# Patient Record
Sex: Female | Born: 2000 | Hispanic: Yes | Marital: Single | Smoking: Never smoker
Health system: Southern US, Community
[De-identification: ages and names within clinical notes are randomized; demographics above are authoritative.]

## PROBLEM LIST (undated history)

## (undated) ENCOUNTER — Inpatient Hospital Stay (HOSPITAL_COMMUNITY): Payer: Self-pay

## (undated) DIAGNOSIS — D649 Anemia, unspecified: Secondary | ICD-10-CM

## (undated) DIAGNOSIS — R002 Palpitations: Secondary | ICD-10-CM

## (undated) DIAGNOSIS — L309 Dermatitis, unspecified: Secondary | ICD-10-CM

## (undated) DIAGNOSIS — F419 Anxiety disorder, unspecified: Secondary | ICD-10-CM

## (undated) DIAGNOSIS — R519 Headache, unspecified: Secondary | ICD-10-CM

## (undated) DIAGNOSIS — F32A Depression, unspecified: Secondary | ICD-10-CM

## (undated) DIAGNOSIS — F329 Major depressive disorder, single episode, unspecified: Secondary | ICD-10-CM

## (undated) HISTORY — DX: Major depressive disorder, single episode, unspecified: F32.9

## (undated) HISTORY — DX: Hypocalcemia: E83.51

## (undated) HISTORY — DX: Depression, unspecified: F32.A

## (undated) HISTORY — PX: NO PAST SURGERIES: SHX2092

---
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
## 2022-01-28 ENCOUNTER — Encounter (HOSPITAL_COMMUNITY): Payer: Self-pay

## 2022-01-28 ENCOUNTER — Emergency Department (HOSPITAL_COMMUNITY)
Admission: EM | Admit: 2022-01-28 | Discharge: 2022-01-28 | Discharge status: Against Medical Advice | Payer: Medicaid Other | Attending: Emergency Medicine | Admitting: Emergency Medicine

## 2022-01-28 ENCOUNTER — Other Ambulatory Visit: Payer: Self-pay

## 2022-01-28 DIAGNOSIS — M545 Low back pain, unspecified: Secondary | ICD-10-CM | POA: Insufficient documentation

## 2022-01-28 DIAGNOSIS — Z5321 Procedure and treatment not carried out due to patient leaving prior to being seen by health care provider: Secondary | ICD-10-CM | POA: Diagnosis not present

## 2022-01-28 DIAGNOSIS — N939 Abnormal uterine and vaginal bleeding, unspecified: Secondary | ICD-10-CM | POA: Insufficient documentation

## 2022-01-28 LAB — URINALYSIS, ROUTINE W REFLEX MICROSCOPIC
Bilirubin Urine: NEGATIVE
Glucose, UA: NEGATIVE mg/dL
Ketones, ur: NEGATIVE mg/dL
Leukocytes,Ua: NEGATIVE
Nitrite: NEGATIVE
Protein, ur: NEGATIVE mg/dL
Specific Gravity, Urine: 1.024 (ref 1.005–1.030)
pH: 6 (ref 5.0–8.0)

## 2022-01-28 LAB — CBC
HCT: 35.8 % — ABNORMAL LOW (ref 36.0–46.0)
Hemoglobin: 11.4 g/dL — ABNORMAL LOW (ref 12.0–15.0)
MCH: 26.9 pg (ref 26.0–34.0)
MCHC: 31.8 g/dL (ref 30.0–36.0)
MCV: 84.4 fL (ref 80.0–100.0)
Platelets: 343 10*3/uL (ref 150–400)
RBC: 4.24 MIL/uL (ref 3.87–5.11)
RDW: 13.1 % (ref 11.5–15.5)
WBC: 7 10*3/uL (ref 4.0–10.5)
nRBC: 0 % (ref 0.0–0.2)

## 2022-01-28 LAB — BASIC METABOLIC PANEL
Anion gap: 8 (ref 5–15)
BUN: 11 mg/dL (ref 6–20)
CO2: 24 mmol/L (ref 22–32)
Calcium: 9.2 mg/dL (ref 8.9–10.3)
Chloride: 107 mmol/L (ref 98–111)
Creatinine, Ser: 0.54 mg/dL (ref 0.44–1.00)
GFR, Estimated: >60 mL/min (ref >60)
Glucose, Bld: 99 mg/dL (ref 70–99)
Potassium: 4 mmol/L (ref 3.5–5.1)
Sodium: 139 mmol/L (ref 135–145)

## 2022-01-28 LAB — I-STAT BETA HCG BLOOD, ED (MC, WL, AP ONLY): I-stat hCG, quantitative: <5 m[IU]/mL (ref <5)

## 2022-01-28 NOTE — ED Provider Triage Note (Signed)
Emergency Medicine Provider Triage Evaluation Note  Catherine Lawrence , a 21 y.o. female  was evaluated in triage.  Pt complains of vaginal bleeding for the past couple weeks since she has been off of her last menstrual cycle.  States she has heavy menstrual cycles at baseline however never abnormal bleeding in between her cycles.  She also endorses low back pain.  She is sexually active with occasional unprotected sex.  Denies abdominal pain.   Review of Systems  Positive: As above Negative: As above  Physical Exam  BP 123/81   Pulse 88   Temp 98.1 F (36.7 C) (Oral)   Resp 16   SpO2 100%  Gen:   Awake, no distress   Resp:  Normal effort  MSK:   Moves extremities without difficulty  Other:    Medical Decision Making  Medically screening exam initiated at 10:31 AM.  Appropriate orders placed.  Caroly Huerta-Garcia was informed that the remainder of the evaluation will be completed by another provider, this initial triage assessment does not replace that evaluation, and the importance of remaining in the ED until their evaluation is complete.     Marita Kansas, PA-C 01/28/22 1032

## 2022-01-28 NOTE — ED Triage Notes (Signed)
Patient complains of back pain and abnormal vaginal bleeding for the past few days, reports only notes the blood when she wipes, reports LMP 1 week ago

## 2022-01-28 NOTE — ED Notes (Signed)
Called x 2 for room placement with no answers.

## 2022-02-13 ENCOUNTER — Other Ambulatory Visit: Payer: Self-pay | Admitting: Family

## 2022-02-13 DIAGNOSIS — N939 Abnormal uterine and vaginal bleeding, unspecified: Secondary | ICD-10-CM

## 2022-02-15 ENCOUNTER — Ambulatory Visit
Admission: RE | Admit: 2022-02-15 | Discharge: 2022-02-15 | Disposition: A | Discharge status: Home / Self Care | Payer: Medicaid Other | Source: Ambulatory Visit | Attending: Family | Admitting: Family

## 2022-02-15 DIAGNOSIS — N939 Abnormal uterine and vaginal bleeding, unspecified: Secondary | ICD-10-CM

## 2022-05-14 ENCOUNTER — Ambulatory Visit (HOSPITAL_COMMUNITY)
Admission: RE | Admit: 2022-05-14 | Discharge: 2022-05-14 | Disposition: A | Discharge status: Home / Self Care | Payer: Medicaid Other | Source: Ambulatory Visit | Attending: Family Medicine | Admitting: Family Medicine

## 2022-05-14 ENCOUNTER — Encounter (HOSPITAL_COMMUNITY): Payer: Self-pay

## 2022-05-14 VITALS — BP 117/71 | HR 63 | Temp 98.3°F | Resp 18

## 2022-05-14 DIAGNOSIS — K219 Gastro-esophageal reflux disease without esophagitis: Secondary | ICD-10-CM

## 2022-05-14 DIAGNOSIS — R6881 Early satiety: Secondary | ICD-10-CM

## 2022-05-14 DIAGNOSIS — R140 Abdominal distension (gaseous): Secondary | ICD-10-CM

## 2022-05-14 MED ORDER — OMEPRAZOLE 20 MG PO CPDR: 20.0000 mg | DELAYED_RELEASE_CAPSULE | Freq: Every day | ORAL | 0 refills | Status: DC

## 2022-05-14 NOTE — Discharge Instructions (Signed)
You were seen today for GI issues.  I have sent out medications to help with this.  If you have continued or worsening symptoms then please be seen locally, or follow up with your primary care provider for further care.

## 2022-05-14 NOTE — ED Provider Notes (Signed)
[facility]    CSN: [id number] Arrival date & time: 05/14/22  1150      History   Chief Complaint No chief complaint on file.   HPI Catherine Lawrence is a 21 y.o. female.   Patient is here for GI upset.  Ate [employer] Saturday morning, then started with chills, low grade fever, nausea and vomiting.  She has been vomiting since Saturday, but none today.  When she eats she feel it getting stuck in her chest.  She is unable to burp and that feels uncomfortable.  No belly pain today, but she was having pain earlier.  She has not eaten much of anything.  She has not had much of a BM, but did have diarrhea recently.  She did take advil, pepto, tea, carbonated drinks.       Past Medical History:  Diagnosis Date   Anemia    Depression    hx of depression    Low calcium levels     There are no problems to display for this patient.   History reviewed. No pertinent surgical history.  OB History   No obstetric history on file.      Home Medications    Prior to Admission medications   Medication Sig Start Date End Date Taking? Authorizing Provider  cetirizine (ZYRTEC) 10 MG tablet Take 1 tablet by mouth daily. 09/21/19  Yes [provider]  FLUoxetine (PROZAC) 20 MG capsule TAKE 1 CAPSULE BY MOUTH EVERY MORNING FOR DEPRESSION OR ANXIETY 01/23/22  Yes [provider]  FEROSUL 325 (65 Fe) MG tablet Take by mouth.    [provider]  hydrocortisone 2.5 % cream Apply topically 3 (three) times daily. 03/10/17   Lowanda Foster, NP  ibuprofen (ADVIL,MOTRIN) 400 MG tablet Take 1 tablet (400 mg total) by mouth every 6 (six) hours as needed for fever or mild pain. 08/28/15   Lowanda Foster, NP    Family History Family History  Problem Relation Age of Onset   Diabetes Mother    Hypertension Mother    Diabetes Father     Social History Social History   Tobacco Use   Smoking status: Never   Smokeless tobacco: Never  Vaping Use    Vaping Use: Never used  Substance Use Topics   Alcohol use: Yes    Comment: socially   Drug use: Never     Allergies   Patient has no known allergies.   Review of Systems Review of Systems  Constitutional: Negative.   HENT: Negative.    Respiratory: Negative.    Cardiovascular: Negative.   Gastrointestinal:  Positive for abdominal pain, constipation, diarrhea, nausea and vomiting.  Musculoskeletal: Negative.   Skin: Negative.   Psychiatric/Behavioral: Negative.       Physical Exam Triage Vital Signs ED Triage Vitals  Enc Vitals Group     BP 05/14/22 1216 117/71     Pulse Rate 05/14/22 1216 63     Resp 05/14/22 1216 18     Temp 05/14/22 1216 98.3 F (36.8 C)     Temp Source 05/14/22 1216 Oral     SpO2 05/14/22 1216 95 %     Weight --      Height --      Head Circumference --      Peak Flow --      Pain Score 05/14/22 1213 0     Pain Loc --      Pain Edu? --      Excl.  in [place]? --    No data found.  Updated Vital Signs BP 117/71 (BP Location: Right Arm)   Pulse 63   Temp 98.3 F (36.8 C) (Oral)   Resp 18   LMP 05/07/2022 (Exact Date)   SpO2 95%   Visual Acuity Right Eye Distance:   Left Eye Distance:   Bilateral Distance:    Right Eye Near:   Left Eye Near:    Bilateral Near:     Physical Exam Constitutional:      General: She is not in acute distress.    Appearance: Normal appearance. She is not ill-appearing.  Cardiovascular:     Rate and Rhythm: Normal rate and regular rhythm.  Pulmonary:     Effort: Pulmonary effort is normal.  Abdominal:     General: Abdomen is flat. Bowel sounds are normal.     Palpations: Abdomen is soft.     Tenderness: There is no guarding or rebound.     Comments: Slight TTP to the lower abdomen;  no epigastric tenderness  Neurological:     General: No focal deficit present.     Mental Status: She is alert.  Psychiatric:        Mood and Affect: Mood normal.      UC Treatments / Results  Labs (all labs  ordered are listed, but only abnormal results are displayed) Labs Reviewed - No data to display  EKG   Radiology No results found.  Procedures Procedures (including critical care time)  Medications Ordered in UC Medications - No data to display  Initial Impression / Assessment and Plan / UC Course  I have reviewed the triage vital signs and the nursing notes.  Pertinent labs & imaging results that were available during my care of the patient were reviewed by me and considered in my medical decision making (see chart for details).   Final Clinical Impressions(s) / UC Diagnoses   Final diagnoses:  Gastroesophageal reflux disease without esophagitis  Bloating  Early satiety     Discharge Instructions      You were seen today for GI issues.  I have sent out medications to help with this.  If you have continued or worsening symptoms then please be seen locally, or follow up with your primary care provider for further care.     ED Prescriptions     Medication Sig Dispense Auth. Provider   omeprazole (PRILOSEC) 20 MG capsule Take 1 capsule (20 mg total) by mouth daily. 30 capsule Jannifer Franklin, MD      PDMP not reviewed this encounter.   Jannifer Franklin, MD 05/14/22 1245

## 2022-05-14 NOTE — ED Triage Notes (Signed)
Pt states she ate [employer] in the morning and started vomiting this afternoon. She hasn't vomited since yesterday but feels like when she burps she taste acid and can't burp without feeling like she is going to vomit. She did take one meds yesterday but it was "[place] medicine". She has been trying to drink a lot of soda so she can burp.   She states when she has a BM it is only a small amount she can't have a normal BM that is also since she ate at [employer] on Saturday morning.

## 2022-06-25 ENCOUNTER — Encounter (HOSPITAL_COMMUNITY): Payer: Self-pay

## 2022-06-25 ENCOUNTER — Ambulatory Visit (HOSPITAL_COMMUNITY)
Admission: EM | Admit: 2022-06-25 | Discharge: 2022-06-25 | Disposition: A | Discharge status: Home / Self Care | Payer: Medicaid Other | Attending: Family Medicine | Admitting: Family Medicine

## 2022-06-25 DIAGNOSIS — K219 Gastro-esophageal reflux disease without esophagitis: Secondary | ICD-10-CM

## 2022-06-25 DIAGNOSIS — M549 Dorsalgia, unspecified: Secondary | ICD-10-CM

## 2022-06-25 MED ORDER — PANTOPRAZOLE SODIUM 40 MG PO TBEC: 40.0000 mg | DELAYED_RELEASE_TABLET | Freq: Every day | ORAL | 0 refills | Status: DC

## 2022-06-25 MED ORDER — TIZANIDINE HCL 4 MG PO TABS: 4.0000 mg | ORAL_TABLET | Freq: Three times a day (TID) | ORAL | 0 refills | Status: DC | PRN

## 2022-06-25 NOTE — ED Triage Notes (Signed)
Pt is here for back pain on and off x few wks the patient also here for acid reflux flare2-3 months

## 2022-06-25 NOTE — Discharge Instructions (Signed)
Pantoprazole 40 mg--1 daily.  This is for gastritis or acid reflux.  It is meant to reduce the amount of acid your stomach makes   Take tizanidine 4 mg--1 every 8 hours as needed for muscle spasms; this medication can cause dizziness and sleepiness  Continue the Tylenol as needed

## 2022-06-25 NOTE — ED Provider Notes (Signed)
[place]    CSN: [id number] Arrival date & time: 06/25/22  0848      History   Chief Complaint Chief Complaint  Patient presents with   Back Pain    Acid reflux    HPI Catherine Lawrence is a 22 y.o. female.    Back Pain  Here for left upper back pain that began bothering her more this morning.  She notes that when she twists her torso or raises her arms overhead it causes pain in the left thoracic area.  No fever or cough.  She has had some back pain in this area off and on in the last 2 weeks.  No trauma or fall noted.  Also in the last 2 to 3 months she has been having burping.  In the middle of December she had a constellation of symptoms that at least initially sounded like a gastroenteritis, but then was left with a feeling of dysphagia and food hanging up in her upper throat, and then after she eats her stomach burns.  She mainly notes that may be heavier foods bother her more.  She does not eat much spicy food  She was prescribed omeprazole 20 mg in the middle of December; she does not feel that helped much.  She is taken some Tylenol for her upper back pain and is helped a little bit.  She notes an allergy to naproxen, consisting of a rash.  She does not remember ever reacting to the ibuprofen she has been prescribed in the past  Past Medical History:  Diagnosis Date   Anemia    Depression    hx of depression    Low calcium levels     There are no problems to display for this patient.   History reviewed. No pertinent surgical history.  OB History   No obstetric history on file.      Home Medications    Prior to Admission medications   Medication Sig Start Date End Date Taking? Authorizing Provider  cetirizine (ZYRTEC) 10 MG tablet Take 1 tablet by mouth daily. 09/21/19  Yes [provider]  FEROSUL 325 (65 Fe) MG tablet Take by mouth.   Yes [provider]  FLUoxetine (PROZAC) 20 MG capsule TAKE 1 CAPSULE BY MOUTH  EVERY MORNING FOR DEPRESSION OR ANXIETY 01/23/22  Yes [provider]  hydrocortisone 2.5 % cream Apply topically 3 (three) times daily. 03/10/17  Yes Kristen Cardinal, NP  omeprazole (PRILOSEC) 20 MG capsule Take 1 capsule (20 mg total) by mouth daily. 05/14/22  Yes Piontek, Junie Panning, MD    Family History Family History  Problem Relation Age of Onset   Diabetes Mother    Hypertension Mother    Diabetes Father     Social History Social History   Tobacco Use   Smoking status: Never   Smokeless tobacco: Never  Vaping Use   Vaping Use: Never used  Substance Use Topics   Alcohol use: Yes    Comment: socially   Drug use: Never     Allergies   Aleve arthritis pain [diclofenac sodium] and Nsaids   Review of Systems Review of Systems  Musculoskeletal:  Positive for back pain.     Physical Exam Triage Vital Signs ED Triage Vitals  Enc Vitals Group     BP 06/25/22 1028 103/65     Pulse Rate 06/25/22 1028 81     Resp 06/25/22 1028 16     Temp 06/25/22 1028 98 F (36.7 C)  Temp Source 06/25/22 1028 Oral     SpO2 06/25/22 1028 97 %     Weight --      Height --      Head Circumference --      Peak Flow --      Pain Score 06/25/22 1022 7     Pain Loc --      Pain Edu? --      Excl. in [place]? --    No data found.  Updated Vital Signs BP 103/65 (BP Location: Right Arm)   Pulse 81   Temp 98 F (36.7 C) (Oral)   Resp 16   LMP 06/17/2022   SpO2 97%   Visual Acuity Right Eye Distance:   Left Eye Distance:   Bilateral Distance:    Right Eye Near:   Left Eye Near:    Bilateral Near:     Physical Exam Vitals reviewed.  Constitutional:      General: She is not in acute distress.    Appearance: She is not ill-appearing, toxic-appearing or diaphoretic.  HENT:     Mouth/Throat:     Mouth: Mucous membranes are moist.     Comments: Mucous membranes slightly pale Eyes:     Extraocular Movements: Extraocular movements intact.     Conjunctiva/sclera:  Conjunctivae normal.     Pupils: Pupils are equal, round, and reactive to light.  Cardiovascular:     Rate and Rhythm: Normal rate and regular rhythm.     Heart sounds: No murmur heard. Pulmonary:     Effort: Pulmonary effort is normal.     Breath sounds: Normal breath sounds.  Abdominal:     General: There is no distension.     Palpations: Abdomen is soft. There is no mass.     Tenderness: There is abdominal tenderness (epigastric area).  Musculoskeletal:     Cervical back: Neck supple.     Comments: There is mild tenderness in the left thoracic area.  No rash  Lymphadenopathy:     Cervical: No cervical adenopathy.  Skin:    Coloration: Skin is not jaundiced.     Findings: No rash.  Neurological:     Mental Status: She is alert and oriented to person, place, and time.  Psychiatric:        Behavior: Behavior normal.      UC Treatments / Results  Labs (all labs ordered are listed, but only abnormal results are displayed) Labs Reviewed - No data to display  EKG   Radiology No results found.  Procedures Procedures (including critical care time)  Medications Ordered in UC Medications - No data to display  Initial Impression / Assessment and Plan / UC Course  I have reviewed the triage vital signs and the nursing notes.  Pertinent labs & imaging results that were available during my care of the patient were reviewed by me and considered in my medical decision making (see chart for details).     She will continue Tylenol and I will prescribe some muscle relaxer for bedtime.  Upper back stretches given and education  Pantoprazole sent in for a bigger dose of 40 mg.  She will be seeing her PCP in about 3 days, and have asked her to discuss all these issues with that provider also. Final Clinical Impressions(s) / UC Diagnoses   Final diagnoses:  None   Discharge Instructions   None    ED Prescriptions   None    PDMP not reviewed this encounter.  Zenia Resides, MD 06/25/22 1056

## 2022-06-28 ENCOUNTER — Other Ambulatory Visit: Payer: Self-pay | Admitting: Family

## 2022-06-28 DIAGNOSIS — R109 Unspecified abdominal pain: Secondary | ICD-10-CM

## 2022-07-08 ENCOUNTER — Ambulatory Visit: Payer: Medicaid Other | Admitting: Physician Assistant

## 2022-07-18 ENCOUNTER — Ambulatory Visit
Admission: RE | Admit: 2022-07-18 | Discharge: 2022-07-18 | Disposition: A | Discharge status: Home / Self Care | Payer: Medicaid Other | Source: Ambulatory Visit | Attending: Family | Admitting: Family

## 2022-07-18 DIAGNOSIS — R109 Unspecified abdominal pain: Secondary | ICD-10-CM

## 2023-05-30 ENCOUNTER — Encounter (HOSPITAL_BASED_OUTPATIENT_CLINIC_OR_DEPARTMENT_OTHER): Payer: Self-pay | Admitting: Student

## 2023-05-30 ENCOUNTER — Ambulatory Visit (HOSPITAL_BASED_OUTPATIENT_CLINIC_OR_DEPARTMENT_OTHER): Payer: Medicaid Other | Admitting: Student

## 2023-05-30 ENCOUNTER — Ambulatory Visit (HOSPITAL_COMMUNITY)
Admission: EM | Admit: 2023-05-30 | Discharge: 2023-05-30 | Disposition: A | Discharge status: Home / Self Care | Payer: Medicaid Other

## 2023-05-30 ENCOUNTER — Encounter (HOSPITAL_COMMUNITY): Payer: Self-pay

## 2023-05-30 ENCOUNTER — Ambulatory Visit (INDEPENDENT_AMBULATORY_CARE_PROVIDER_SITE_OTHER): Payer: Medicaid Other

## 2023-05-30 DIAGNOSIS — M25531 Pain in right wrist: Secondary | ICD-10-CM

## 2023-05-30 DIAGNOSIS — S6991XA Unspecified injury of right wrist, hand and finger(s), initial encounter: Secondary | ICD-10-CM

## 2023-05-30 MED ORDER — IBUPROFEN 800 MG PO TABS: 800.0000 mg | ORAL_TABLET | Freq: Three times a day (TID) | ORAL | 0 refills | Status: DC

## 2023-05-30 MED ORDER — METHOCARBAMOL 500 MG PO TABS: 500.0000 mg | ORAL_TABLET | Freq: Two times a day (BID) | ORAL | 0 refills | Status: DC

## 2023-05-30 MED ORDER — IBUPROFEN 800 MG PO TABS
800.0000 mg | ORAL_TABLET | Freq: Once | ORAL | Status: AC
  Administered 2023-05-30: 800 mg via ORAL

## 2023-05-30 MED ORDER — IBUPROFEN 800 MG PO TABS
ORAL_TABLET | ORAL | Status: AC
  Filled 2023-05-30: qty 1
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
## 2023-05-30 NOTE — Discharge Instructions (Addendum)
Your imaging did not show any broken bones.  Please wear the thumb spica to help compress and provide support to your right hand.  You can alternate between Tylenol and ibuprofen every 4-6 hours for pain and inflammation.  Try the muscle relaxer, this may cause sedation so do not drink alcohol or drive on this medication.    If your pain persists into next week, please follow-up with an orthopedic for further evaluation.

## 2023-10-01 NOTE — Progress Notes (Signed)
 Subjective   Catherine Lawrence is a 23 year old female who presents today for  Chief Complaint  Patient presents with  . Headache    Pt says that the medication that she was prescribed has helped and she would like a refill on medication.   . Reflux    Patient was previously seen in 2024 for reflux.  In February 2024 she reported poor compliance with pantoprazole .  Had improvement to abdominal pain but continued indigestion.  She had had normal abdominal ultrasound discussed importance of daily PPI.  Patient was to take pantoprazole  daily for 1 month and follow-up  She was seen 11/06/2022 by [facility] primary care as a new patient with concern of headaches..  Reported random, shooting pain at different places 3-4 times a day.  Neurological exam was normal.  It was recommended she wear glasses and not skip meals, monitor symptoms.  Does not appear any medication was prescribed  Pt says she has not had pantoprazole .  Ran out more than a month ago.  When she was on pantoprazole  it was helping.  Burning sensation has returned as has indigestion.  Without pantoprazole  symptoms returned in about 4 days.   Reports years of headaches.  Pain is like a pinching which lasts a few seconds and then goes away.  Makes it hard to concentrate.  Occur 2-3 times a week.  Triggered by bright light, stress.  Worse during day.  She takes Tylenol whenever I need it, about every time that it happens and for other pains (3-4 times a week).  Tylenol helps some.  Has not tried anything else to help.  She has been trying to drink more water, wear glasses when driving.     Patient Active Problem List   Diagnosis Date Noted  . Prediabetes 07/24/2022  . Menorrhagia   . Constipation   . Depression 11/23/2018  . IDA (iron deficiency anemia) 11/23/2018  . Heart palpitations 11/23/2018    Past Medical History:  Diagnosis Date  . Anemia   . Depression 11/23/2018  . Heart palpitations 11/23/2018  . IDA (iron  deficiency anemia) 11/23/2018    No past surgical history on file.  Tobacco History   Tobacco Use  Smoking Status Never  . Passive exposure: Never  Smokeless Tobacco Never   Social History   Substance and Sexual Activity  Alcohol Use Yes  . Alcohol/week: 3.0 standard drinks of alcohol  . Types: 3 Shots of liquor per week   Comment: Mixed drinks   Social History   Substance and Sexual Activity  Drug Use Not Currently  . Types: Marijuana   Social History   Substance and Sexual Activity  Sexual Activity Yes  . Partners: Male  . Birth control/protection: Condom     02/22/2022 11:29 AM  How hard is it for you to pay for the very basics like food, housing, heating, medical care, and medications? Not hard at all  How hard is it for you to pay for the very basics like food, housing, heating, medical care, and medications? Not hard at all  What is your living situation today?  I have a steady place to live  Think about the place you live. Do you have problems with any of the following?  None of the above  Number of positive responses to housing questions 0  Within the past 12 months, you worried that your food would run out before you got money to buy more. Never true  Within the past  12 months, the food you bought just didn't last and you didn't have money to get more. Never true  Number of positive responses to food security questions 0  In the past 12 months, has lack of transportation kept you from medical appointments, meetings, work or from getting things needed for daily living? No  In the past 12 months has the electric, gas, oil, or water company threatened to shut off services in your home? No  How often does anyone, including family and friends, physically hurt you? Never  How often does anyone, including family and friends, insult or talk down to you? Never  How often does anyone, including family and friends, threaten you with harm? Never  How often does anyone,  including family and friends, scream or curse at you? Never  Relationship Safety Total Score: >11 is abnormal 4    Family History  Problem Relation Name Age of Onset  . Hypertension Mother    . High Cholesterol Mother    . Diabetes Mother    . Hypertension Father    . High Cholesterol Father    . Diabetes Father       Review of Systems  Constitutional:  Negative for chills and fever.  HENT:  Negative for sinus pressure.   Eyes:  Positive for photophobia.  Respiratory:  Negative for shortness of breath.   Cardiovascular:  Negative for chest pain.  Gastrointestinal:  Positive for constipation, diarrhea, heartburn, nausea and indigestion. Negative for abdominal pain and vomiting.  Neurological:  Positive for headaches. Negative for dizziness and weakness.    Objective   Vitals:   10/01/23 1014  BP: 127/78  Pulse: 91  Resp: 16  Temp: 98.4 F (36.9 C)  TempSrc: Oral  SpO2: 97%  Weight: 201 lb (91.2 kg)  Height: 5' 3 (1.6 m)   Estimated body mass index is 35.61 kg/m as calculated from the following:   Height as of this encounter: 5' 3 (1.6 m).   Weight as of this encounter: 201 lb (91.2 kg). Facility age limit for growth %iles is 20 years.   Physical Exam Vitals reviewed.   Constitutional:      General: Damia is not in acute distress. HENT:     Head: Normocephalic and atraumatic.   Cardiovascular:     Rate and Rhythm: Normal rate and regular rhythm.     Pulses: Normal pulses.  Pulmonary:     Effort: Pulmonary effort is normal.     Breath sounds: Normal breath sounds.  Abdominal:     General: Abdomen is flat. Bowel sounds are normal. There is no distension.     Palpations: Abdomen is soft. There is no mass.     Tenderness: There is no abdominal tenderness. There is no guarding or rebound.  Musculoskeletal:     Right lower leg: No edema.     Left lower leg: No edema.  Skin:    General: Skin is warm and dry.  Neurological:     General: No focal deficit  present.     Mental Status: Jood is alert.     Cranial Nerves: Cranial nerves 2-12 are intact. No cranial nerve deficit or facial asymmetry.     Sensory: Sensation is intact.     Motor: Motor function is intact. No weakness, tremor or pronator drift.     Coordination: Coordination is intact. Coordination normal. Heel to Shin Test normal.     Gait: Gait is intact.  Psychiatric:  Mood and Affect: Mood normal.     Assessment and Plan  1. Gastroesophageal reflux disease, unspecified whether esophagitis present (Primary) Has been without pantoprazole  unknown amount of time.  Pantoprazole  was beneficial.  Symptoms returned rapidly (within a week) after starting. Wil refer to GI for EGD as previously discussed  - REFERRAL TO GASTROENTEROLOGY - pantoprazole  (PROTONIX ) 40 mg EC tablet; Take 1 Tablet by mouth once daily  Dispense: 90 Tablet; Refill: 0  2. Sharp headache Present for years.  Pinching feeling that lasts only a few seconds.  Occurring 2-4 times a week.  Worse with light, stress, during the day.  Takes Tylenol (for other reasons as well) which helps some.  Has tried wearing her glasses, drinking more water without relief  She is neurologically intact today.  Suspect possible occipital neuralgia but will refer to neuro for assistance. Discussed preventative medicine due to HA frequency however pt declines.  Discussed continued supportive care and pt given ED precautions  - REFERRAL TO NEUROLOGY - TSH + FREE T4 - FE+CBC/D/PLT/TIBC/FER/RETIC - BASIC METABOLIC PANEL CALCIUM TOTAL  3. Screening for thyroid disorder  - TSH + FREE T4  4. Iron deficiency anemia, unspecified iron deficiency anemia type  - ferrous sulfate 325 mg (65 mg iron) tablet; Take 1 Tablet by mouth every other day  Dispense: 45 Tablet; Refill: 0 - FE+CBC/D/PLT/TIBC/FER/RETIC  5. Menorrhagia with regular cycle Referral per pt request  - REFERRAL TO GYNECOLOGY     Would like to discuss weight loss  medicine.  Says she has been following calorie deficit, eating more protein

## 2023-10-01 NOTE — Unmapped External Note (Signed)
 [id number] en   Occipital Neuralgia      Occipital neuralgia is a type of intense headache. It occurs when the nerves that go from the top of your spine to your scalp (called the occipital nerves) are pinched or injured. This sends pain from the upper part of the neck to the back of the head and the scalp. This pain may be:   ?  A sharp, shooting pain or an aching, throbbing pain.   ?  On one or both sides of the head.   ?  Behind an eye and ear.   ?  In the forehead.   ?  Set off by simple movement.   The pain may happen suddenly, caused by a pinched nerve in your neck or by an injury. Or it may be linked to another condition. Sometimes the exact cause isn?t known.   Occipital neuralgia can be hard to diagnose. That's because some symptoms are the same as migraines and other types of headaches. There isn?t a particular test that can be used for diagnosis. A physical and neurological exam will be done. Imaging tests may also be done, such as an MRI or a CT scan. In some cases, a diagnosis may be made if the pain is relieved by an injection of a local anesthetic and a steroid (called an occipital nerve block).   Many people feel better after home treatment, which focuses on easing pain. In some cases, surgery is needed to reduce pressure on the occipital nerves.   Home care   Here?s how to ease your pain at home:   ?  Rest. Being in a quiet room may help.   ?  Heat therapy. Using a heating pad or a warm compress on the area that hurts can help reduce muscle tightness and ease pain.   ?  Massage. Gently massaging the base of your skull can help to ease tight neck muscles.   ?  Physical therapy. A physical therapist can show you exercises and stretches to make the back and neck muscles stronger.   ?  Medicines. Your health care provider may advise you to take over-the-counter anti-inflammatory medicines.   Other treatments may include:   ?  Prescription medicines. Your provider may prescribe muscle relaxers, antidepressants, or anti-seizure medicines.   ?  Occipital nerve block. The same injection of a local anesthetic and nerve block that?s used to diagnose occipital neuralgia can also be used to treat the pain.   ?  Botulinum toxin injections. These can help by reducing nerve inflammation.       Follow-up care   Follow up with your health care provider, or as advised.       When to get medical advice   Contact your health care provider right away if home treatments aren?t working or your pain is getting worse. Your provider may recommend surgery to decrease pressure on the occipital nerves.     Last Reviewed Date: 08/02/2023 00:00:00   ? 2000-2025 [employer], [place]. All rights reserved. This information is not intended as a substitute for professional medical care. Always follow your healthcare professional's instructions.

## 2023-10-06 NOTE — Result Encounter Note (Signed)
 Labs show iron deficiency anemia.  Will restart iron supplement.  Thyroid function and electrolytes within normal limits.  Results and actions communicated to patient via MyChart

## 2023-10-08 ENCOUNTER — Ambulatory Visit: Admitting: Orthopaedic Surgery

## 2023-10-16 ENCOUNTER — Other Ambulatory Visit (INDEPENDENT_AMBULATORY_CARE_PROVIDER_SITE_OTHER): Payer: Self-pay

## 2023-10-16 ENCOUNTER — Ambulatory Visit: Admitting: Orthopaedic Surgery

## 2023-10-16 ENCOUNTER — Encounter: Payer: Self-pay | Admitting: Orthopaedic Surgery

## 2023-10-16 DIAGNOSIS — M79641 Pain in right hand: Secondary | ICD-10-CM
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
## 2023-11-12 ENCOUNTER — Encounter (HOSPITAL_COMMUNITY): Payer: Self-pay | Admitting: Obstetrics and Gynecology

## 2023-11-12 ENCOUNTER — Ambulatory Visit (HOSPITAL_COMMUNITY)
Admission: EM | Admit: 2023-11-12 | Discharge: 2023-11-12 | Disposition: A | Discharge status: Home / Self Care | Attending: Internal Medicine | Admitting: Internal Medicine

## 2023-11-12 ENCOUNTER — Inpatient Hospital Stay (HOSPITAL_COMMUNITY)

## 2023-11-12 ENCOUNTER — Encounter (HOSPITAL_COMMUNITY): Payer: Self-pay

## 2023-11-12 ENCOUNTER — Inpatient Hospital Stay (HOSPITAL_COMMUNITY)
Admission: AD | Admit: 2023-11-12 | Discharge: 2023-11-12 | Disposition: A | Discharge status: Home / Self Care | Attending: Obstetrics and Gynecology | Admitting: Obstetrics and Gynecology

## 2023-11-12 DIAGNOSIS — M5489 Other dorsalgia: Secondary | ICD-10-CM

## 2023-11-12 DIAGNOSIS — Z3A01 Less than 8 weeks gestation of pregnancy: Secondary | ICD-10-CM | POA: Diagnosis not present

## 2023-11-12 DIAGNOSIS — M5442 Lumbago with sciatica, left side: Secondary | ICD-10-CM | POA: Diagnosis not present

## 2023-11-12 DIAGNOSIS — Z3201 Encounter for pregnancy test, result positive: Secondary | ICD-10-CM | POA: Diagnosis not present

## 2023-11-12 DIAGNOSIS — M545 Low back pain, unspecified: Secondary | ICD-10-CM | POA: Diagnosis not present

## 2023-11-12 DIAGNOSIS — Z3A08 8 weeks gestation of pregnancy: Secondary | ICD-10-CM

## 2023-11-12 DIAGNOSIS — O26891 Other specified pregnancy related conditions, first trimester: Secondary | ICD-10-CM

## 2023-11-12 DIAGNOSIS — Z3490 Encounter for supervision of normal pregnancy, unspecified, unspecified trimester: Secondary | ICD-10-CM

## 2023-11-12 HISTORY — DX: Dermatitis, unspecified: L30.9

## 2023-11-12 HISTORY — DX: Anxiety disorder, unspecified: F41.9

## 2023-11-12 HISTORY — DX: Headache, unspecified: R51.9

## 2023-11-12 LAB — WET PREP, GENITAL
Clue Cells Wet Prep HPF POC: NONE SEEN
Sperm: NONE SEEN
Trich, Wet Prep: NONE SEEN
WBC, Wet Prep HPF POC: >=10 — AB (ref <10)
Yeast Wet Prep HPF POC: NONE SEEN

## 2023-11-12 LAB — URINALYSIS, ROUTINE W REFLEX MICROSCOPIC
Bilirubin Urine: NEGATIVE
Glucose, UA: NEGATIVE mg/dL
Ketones, ur: NEGATIVE mg/dL
Nitrite: NEGATIVE
Protein, ur: NEGATIVE mg/dL
Specific Gravity, Urine: 1.018 (ref 1.005–1.030)
pH: 5 (ref 5.0–8.0)

## 2023-11-12 LAB — HCG, QUANTITATIVE, PREGNANCY: hCG, Beta Chain, Quant, S: 127975 m[IU]/mL — ABNORMAL HIGH (ref <5)

## 2023-11-12 LAB — CBC
HCT: 36.2 % (ref 36.0–46.0)
Hemoglobin: 11.9 g/dL — ABNORMAL LOW (ref 12.0–15.0)
MCH: 27.4 pg (ref 26.0–34.0)
MCHC: 32.9 g/dL (ref 30.0–36.0)
MCV: 83.2 fL (ref 80.0–100.0)
Platelets: 310 10*3/uL (ref 150–400)
RBC: 4.35 MIL/uL (ref 3.87–5.11)
RDW: 15.5 % (ref 11.5–15.5)
WBC: 11.1 10*3/uL — ABNORMAL HIGH (ref 4.0–10.5)
nRBC: 0 % (ref 0.0–0.2)

## 2023-11-12 LAB — POCT URINE PREGNANCY: Preg Test, Ur: POSITIVE — AB

## 2023-11-12 LAB — ABO/RH: ABO/RH(D): O POS

## 2023-11-12 MED ORDER — CYCLOBENZAPRINE HCL 5 MG PO TABS: 5.0000 mg | ORAL_TABLET | Freq: Three times a day (TID) | ORAL | 0 refills | Status: AC | PRN

## 2023-11-12 MED ORDER — CYCLOBENZAPRINE HCL 5 MG PO TABS
10.0000 mg | ORAL_TABLET | Freq: Once | ORAL | Status: AC
  Administered 2023-11-12: 10 mg via ORAL
  Filled 2023-11-12: qty 2

## 2023-11-12 NOTE — Discharge Instructions (Signed)
 Please go to the Maternity Assessment Unit (MAU) at [facility] for further workup and evaluation due to concern for pregnancy related emergency with low back pain.

## 2023-11-12 NOTE — Discharge Instructions (Signed)
 [email or web address]   [facility] for [employer] at [facility]             [address] [place], [place] [address] [phone]  Center for [employer] at [facility]                                                             [address], [address], [place], [place], [address] [phone]  Center for [facility] at [facility] [address], [address], [place], [place], [address] [phone]  Center for [facility] at [facility] [address], [place], [place], [address] [phone]  Center for [facility] at [facility]                                 [address] [place], [place], [place], [address] [phone]  Center for [facility] at [facility]                                    [address], [place], [place], [address] [phone]  Center for [facility] at [facility] [address], [place], [place], [address]                              [facility]  [address], [place], [place], [address] [phone]  [facility]         Phone: [phone]  [facility]      Phone: [phone]   [facility]      Phone: [phone]  [facility]         Phone: [phone]   [facility]    Phone: [phone]  [facility]      Phone: [phone]  [facility]     Phone: [phone]  [facility]        Phone: [phone]  [facility]) [phone]  [facility]      Phone: [phone]  Safe Medications in Pregnancy   Acne:  Benzoyl Peroxide  Salicylic Acid    Backache/Headache:  Tylenol: 2 regular strength every 4 hours OR               2 Extra strength every 6 hours   Colds/Coughs/Allergies:  Benadryl (alcohol free) 25 mg every 6 hours as needed  Breath right strips  Claritin  Cepacol throat lozenges  Chloraseptic throat spray  Cold-Eeze- up to three times per day  Cough drops, alcohol free  Flonase (by prescription only)  Guaifenesin  Mucinex  Robitussin DM (plain only, alcohol free)  Saline nasal spray/drops  Sudafed (pseudoephedrine) & Actifed * use only after [redacted] weeks gestation and if you do not have high blood pressure  Tylenol  Vicks  Vaporub  Zinc lozenges  Zyrtec   Constipation:  Colace  Ducolax suppositories  Fleet enema  Glycerin suppositories  Metamucil  Milk of magnesia  Miralax  Senokot  Smooth move tea   Diarrhea:  Kaopectate  Imodium A-D   *NO pepto Bismol   Hemorrhoids:  Anusol   Anusol  HC  Preparation H  Tucks   Indigestion:  Tums  Maalox  Mylanta  Zantac  Pepcid   Insomnia:  Benadryl (alcohol free) 25mg  every 6 hours as needed  Tylenol PM  Unisom, no Gelcaps   Leg Cramps:  Tums  MagGel   Nausea/Vomiting:  Bonine  Dramamine  Emetrol  Ginger extract  Sea bands  Meclizine  Nausea medication to take during pregnancy:  Unisom (doxylamine succinate 25 mg tablets) Take one tablet daily at bedtime. If symptoms are not adequately controlled, the dose can be increased to a maximum recommended dose of two tablets daily (1/2 tablet in the morning, 1/2 tablet mid-afternoon and one at bedtime).  Vitamin B6 100mg  tablets. Take one tablet twice a day (up to 200 mg per day).   Skin Rashes:  Aveeno products  Benadryl cream or 25mg  every 6 hours as needed  Calamine Lotion  1% cortisone cream   Yeast infection:  Gyne-lotrimin 7  Monistat 7    **If taking multiple medications, please check labels to avoid duplicating the same active ingredients  **take medication as directed on the label   ** Do not exceed 4000 mg of tylenol in 24 hours  **Do not take medications that contain aspirin or ibuprofen 

## 2023-11-12 NOTE — ED Triage Notes (Signed)
 Pt c/o lt lower back pain radiating down lt leg since 6am. Denies known injury. Denies taken any meds. States she is [redacted]wks pregnant.

## 2023-11-12 NOTE — ED Provider Notes (Addendum)
 [facility]    CSN: [id number] Arrival date & time: 11/12/23  0803      History   Chief Complaint Chief Complaint  Patient presents with   Back Pain    HPI Catherine Lawrence is a 23 y.o. female.   Patient presents to urgent care for evaluation of left-sided low back pain that radiates to the left posterior leg starting this morning.  Pain started after she bent forward and stood back up after using the restroom.  She has never experienced this type of pain in the past.  She is currently [redacted] weeks pregnant (last menstrual cycle September 13, 2023).  She had an ultrasound performed 2 weeks ago to evaluate for intrauterine pregnancy versus ectopic pregnancy etc.  She states they were unable to visualize a gestational sac on the ultrasound and told her that it was too early and to follow-up with her OB/GYN as scheduled.  Denies recent vaginal bleeding, abdominal pain, fever, chills, urinary symptoms, dizziness, and saddle anesthesia symptoms.  She denies history of low back pain at baseline.  She has not attempted use of any over-the-counter medications to help with symptoms PTA.   Back Pain   Past Medical History:  Diagnosis Date   Anemia    Depression    hx of depression    Low calcium levels     There are no active problems to display for this patient.   History reviewed. No pertinent surgical history.  OB History     Gravida  1   Para      Term      Preterm      AB      Living         SAB      IAB      Ectopic      Multiple      Live Births               Home Medications    Prior to Admission medications   Medication Sig Start Date End Date Taking? Authorizing Provider  Prenatal Vit-Fe Fumarate-FA (PRENATAL MULTIVITAMIN) TABS tablet Take 1 tablet by mouth daily at 12 noon.   Yes [provider]  cetirizine (ZYRTEC) 10 MG tablet Take 1 tablet by mouth daily. 09/21/19   [provider]  FEROSUL 325 (65 Fe) MG tablet  Take by mouth.    [provider]    Family History Family History  Problem Relation Age of Onset   Diabetes Mother    Hypertension Mother    Diabetes Father     Social History Social History   Tobacco Use   Smoking status: Never   Smokeless tobacco: Never  Vaping Use   Vaping status: Never Used  Substance Use Topics   Alcohol use: Not Currently    Comment: socially   Drug use: Never     Allergies   Aleve arthritis pain [diclofenac sodium]   Review of Systems Review of Systems  Musculoskeletal:  Positive for back pain.  Per HPI   Physical Exam Triage Vital Signs ED Triage Vitals [11/12/23 0822]  Encounter Vitals Group     BP 120/88     Systolic BP Percentile      Diastolic BP Percentile      Pulse Rate 97     Resp 16     Temp 98.2 F (36.8 C)     Temp Source Oral     SpO2 97 %  Weight      Height      Head Circumference      Peak Flow      Pain Score 7     Pain Loc      Pain Education      Exclude from Growth Chart    No data found.  Updated Vital Signs BP 120/88 (BP Location: Left Arm)   Pulse 97   Temp 98.2 F (36.8 C) (Oral)   Resp 16   LMP 09/13/2023 (Exact Date)   SpO2 97%   Visual Acuity Right Eye Distance:   Left Eye Distance:   Bilateral Distance:    Right Eye Near:   Left Eye Near:    Bilateral Near:     Physical Exam Vitals and nursing note reviewed.  Constitutional:      Appearance: She is not ill-appearing or toxic-appearing.  HENT:     Head: Normocephalic and atraumatic.     Right Ear: Hearing and external ear normal.     Left Ear: Hearing and external ear normal.     Nose: Nose normal.     Mouth/Throat:     Lips: Pink.  Eyes:     General: Lids are normal. Vision grossly intact. Gaze aligned appropriately.     Extraocular Movements: Extraocular movements intact.     Conjunctiva/sclera: Conjunctivae normal.  Pulmonary:     Effort: Pulmonary effort is normal.  Musculoskeletal:     Cervical back:  Normal and neck supple.     Thoracic back: Normal.     Lumbar back: Tenderness present. No swelling, edema, deformity, signs of trauma, lacerations, spasms or bony tenderness. Decreased range of motion. Negative right straight leg raise test and negative left straight leg raise test. No scoliosis.     Comments: Tender to palpation over the left lumbar paraspinous muscles.  Strength and sensation intact to bilateral upper and lower extremities (5/5). Moves all 4 extremities with normal coordination voluntarily. Non-focal neuro exam.   Skin:    General: Skin is warm and dry.     Capillary Refill: Capillary refill takes less than 2 seconds.     Findings: No rash.  Neurological:     General: No focal deficit present.     Mental Status: She is alert and oriented to person, place, and time. Mental status is at baseline.     Cranial Nerves: No dysarthria or facial asymmetry.  Psychiatric:        Mood and Affect: Mood normal.        Speech: Speech normal.        Behavior: Behavior normal.        Thought Content: Thought content normal.        Judgment: Judgment normal.      UC Treatments / Results  Labs (all labs ordered are listed, but only abnormal results are displayed) Labs Reviewed  POCT URINE PREGNANCY - Abnormal; Notable for the following components:      Result Value   Preg Test, Ur Positive (*)    All other components within normal limits    EKG   Radiology No results found.  Procedures Procedures (including critical care time)  Medications Ordered in UC Medications - No data to display  Initial Impression / Assessment and Plan / UC Course  I have reviewed the triage vital signs and the nursing notes.  Pertinent labs & imaging results that were available during my care of the patient were reviewed by me and considered in  my medical decision making (see chart for details).   1. Acute left sided low back pain with left-sided sciatica, [redacted] weeks gestation of pregnancy,  positive pregnancy test Acute left-sided low back pain is likely musculoskeletal in nature, however given pregnancy status with pregnancy that has not been verified to be intrauterine on ultrasound, I would like for her to proceed to the MAU for further workup and evaluation to rule out pregnancy related emergency including ectopic pregnancy.  Pregnancy test is positive in clinic.  Discussed clinical concerns/exam findings leading to recommendation for further workup in the ER setting and risks of deferring ER visit with patient/family. Patient/family express understanding and agreement with plan, discharged to ER via private car with mother and sibling.   Final Clinical Impressions(s) / UC Diagnoses   Final diagnoses:  Acute left-sided low back pain with left-sided sciatica  [redacted] weeks gestation of pregnancy  Positive pregnancy test   Discharge Instructions      Please go to the Maternity Assessment Unit (MAU) at [facility] and [facility] for further workup and evaluation due to concern for pregnancy related emergency with low back pain.   ED Prescriptions   None    PDMP not reviewed this encounter.   Starlene Eaton, FNP 11/12/23 0921    Starlene Eaton, FNP 11/12/23 [phone]

## 2023-11-12 NOTE — MAU Provider Note (Signed)
 Transfer from [facility], back pain    S [name] is a 23 y.o. G1P0 pregnant female at [redacted]w[redacted]d who presents to MAU today with complaint of L sided low back pain that appears to be L paraspinal pain based on work up at [employer]. Pt states pain after she bent forward and stood back up after using restroom.  Paraspinal TTP.  Denies VB, abdominal pain, F/C, urinary symptoms, dizziness, saddle anesthesia.  PE with negative straight leg test bilaterally.  Work up positive preg test.  LMP [redacted]w[redacted]d gestational age.   Given flexeril at [facility] with mild relief, pt reports taking PNV.   Pertinent items noted in HPI and remainder of comprehensive ROS otherwise negative.   O BP 124/67 (BP Location: Right Arm)   Pulse 82   Temp 99 F (37.2 C) (Oral)   Resp 17   Ht 5' 3 (1.6 m)   Wt 93.6 kg   LMP 09/13/2023 (Exact Date)   SpO2 100%   BMI 36.54 kg/m  Physical Exam Vitals and nursing note reviewed.  Constitutional:      General: She is not in acute distress.    Appearance: Normal appearance. She is not ill-appearing.  HENT:     Head: Normocephalic and atraumatic.     Right Ear: External ear normal.     Left Ear: External ear normal.     Nose: Nose normal. No congestion.     Mouth/Throat:     Mouth: Mucous membranes are moist.     Pharynx: Oropharynx is clear.  Eyes:     Extraocular Movements: Extraocular movements intact.     Conjunctiva/sclera: Conjunctivae normal.  Cardiovascular:     Rate and Rhythm: Normal rate.  Pulmonary:     Effort: Pulmonary effort is normal. No respiratory distress.  Abdominal:     General: Abdomen is flat. There is no distension.     Palpations: Abdomen is soft.     Tenderness: There is no abdominal tenderness.  Musculoskeletal:        General: Tenderness (Left lower paraspinal TTP) present.     Cervical back: Normal range of motion.  Skin:    General: Skin is warm and dry.  Neurological:     Mental Status: She is alert and oriented to person, place, and  time. Mental status is at baseline.     Motor: No weakness.     Gait: Gait normal.  Psychiatric:        Mood and Affect: Mood normal.        Behavior: Behavior normal.      MDM: MAU Course:  [place] w/u: CBC Hgb 11.9 ABO/Rh collected  hCG collected GC collected Wet Prep negative UA appears noninfectious  TVUS = SIUP measuring [redacted]w[redacted]d with cardiac activity, small SCH, small physiologic cyst on L ovary  AP #[redacted] weeks gestation #SIUP #Left paraspinal pain - given flexeril PRN  - given heat back  Discharge from MAU in stable condition with strict/usual precautions Follow up at desired OBGYN as scheduled for ongoing prenatal care  Allergies as of 11/12/2023       Reactions   Aleve Arthritis Pain [diclofenac Sodium] Rash        Medication List     TAKE these medications    cetirizine 10 MG tablet Commonly known as: ZYRTEC Take 1 tablet by mouth daily.   cyclobenzaprine 5 MG tablet Commonly known as: FLEXERIL Take 1 tablet (5 mg total) by mouth 3 (three) times daily as needed for up  to 7 days for muscle spasms.   FeroSul 325 (65 Fe) MG tablet Generic drug: ferrous sulfate Take by mouth.   prenatal multivitamin Tabs tablet Take 1 tablet by mouth daily at 12 noon.        Ebony Goldstein, MD 11/12/2023 11:20 AM

## 2023-11-12 NOTE — MAU Note (Signed)
 Catherine Lawrence is a 23 y.o. at [redacted]w[redacted]d, here in MAU reporting: started having stabbing pain in left lower back this morning. Denies bleeding.  Had US  at [facility] 2 wks ago, too early too see anything, tomorrow is f/u.  Loose BM this morning. Denies pain or burning with urination.  Denies fever.  LMP: 4/12 Onset of complaint: this morning Pain score: moderate Vitals:   11/12/23 1023  BP: 124/67  Pulse: 82  Resp: 17  Temp: 99 F (37.2 C)  SpO2: 100%      Lab orders placed from triage:  blood work drawn in triage    Hurts when she walks, causing her to limp.  Neg CVA tenderness.

## 2023-11-12 NOTE — ED Notes (Signed)
 Patient is being discharged from the Urgent Care and sent to the MAU via POV . Per Michelle,NP, patient is in need of higher level of care due to back pain in early pregnancy. Patient is aware and verbalizes understanding of plan of care.  Vitals:   11/12/23 0822  BP: 120/88  Pulse: 97  Resp: 16  Temp: 98.2 F (36.8 C)  SpO2: 97%

## 2023-11-13 LAB — GC/CHLAMYDIA PROBE AMP (~~LOC~~) NOT AT ARMC
Chlamydia: NEGATIVE
Comment: NEGATIVE
Comment: NORMAL
Neisseria Gonorrhea: NEGATIVE

## 2023-11-18 ENCOUNTER — Ambulatory Visit: Admitting: Orthopaedic Surgery

## 2023-11-21 ENCOUNTER — Encounter: Payer: Self-pay | Admitting: Neurology

## 2023-11-21 ENCOUNTER — Ambulatory Visit (INDEPENDENT_AMBULATORY_CARE_PROVIDER_SITE_OTHER): Admitting: Neurology

## 2023-11-21 VITALS — BP 130/82 | HR 94 | Ht 63.0 in | Wt 201.0 lb

## 2023-11-21 DIAGNOSIS — R51 Headache with orthostatic component, not elsewhere classified: Secondary | ICD-10-CM | POA: Diagnosis not present

## 2023-11-21 DIAGNOSIS — Z3491 Encounter for supervision of normal pregnancy, unspecified, first trimester: Secondary | ICD-10-CM

## 2023-11-21 DIAGNOSIS — G441 Vascular headache, not elsewhere classified: Secondary | ICD-10-CM

## 2023-11-21 DIAGNOSIS — G4484 Primary exertional headache: Secondary | ICD-10-CM | POA: Diagnosis not present

## 2023-11-21 DIAGNOSIS — R519 Headache, unspecified: Secondary | ICD-10-CM

## 2023-11-21 NOTE — Progress Notes (Unsigned)
 [facility]    Provider:  Dr Tresia Fruit Requesting Provider: Leanora Prophet, PA-C Primary Care Provider:  Leanora Prophet, PA-C  CC:  headaches, currently approx [redacted] weeks pregnant(was 8w4 on 11/06/2022 per ed notes seen for acute low back pain); Patient here alone for referral for sharp headaches. Patient is [redacted] weeks pregnant and has been vomiting in the morning. Yesterday she was sick all day and had a headache. Patient states that for the last 2 wks she gets a pulsing pain across the back of her head when she vomits. She has had headaches overall for about 2 years. She has tried Tylenol and it does help but recently she has been vomiting after taking it. Before pregnancy she got headaches 3-4 times a week. Now they are daily. Iron is low.   HPI:  Catherine Lawrence is a 23 y.o. female here as requested by Leanora Prophet, PA-C for headache.  Past medical history prediabetes, constipation, depression, iron deficiency anemia, heart palpitations, seasonal allergies, eczema, concern for irregular periods, 3 standard drinks of alcohol a week 3 shots of liquor, uses condoms for birth control.  I reviewed Algis Anton notes from [facility] encounter date October 01, 2023, patient presented with headache, she was seen by [facility] primary care as a new patient with a concern of headaches in November 06, 2022, neurologic exam was normal, it was recommended she wear glasses and not skip meals monitor symptoms, no medication was prescribed, reports years of headaches, pain is like a pinching which lasts a few seconds and then goes away, makes it hard to concentrate, occurs 2-3 times a week, triggered by bright light stress, worse during the day, she takes Tylenol as needed, about every time that it happens and for other pains 3-4 times a week, Tylenol helps some, she has been trying to drink more water wear glasses when driving.  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  TSH and free T4 was ordered I do not have those results.  She was also recently seen in the emergency room for acute onset low back pain and given Flexeril .  I reviewed notes from the emergency room Dr. Scherrie Curt November 12, 2023.  Headaches worsening past week acutely. Even when she gags she has pain in the back of the head. When vomiting. This is new, never had headaches like this in the past, hurts and feels like she is going to pas out, Just happening in the morning ut yesterday she was vomiting and went away but could still feel pulsing. She has an OB but has not had an appointment yet (encouraged her to discuss the nausea and treat), headaches in the past were never like this, it was a regular headache and her head would hurt for a few hours and go away, this one is more sharp from the back of the head (points to occipitalis) and radiates to the side. Iight makes it worse. Brief, lasts about 5 minutes only when vomiting, vomiting has been worsening and o has the headache.no vision changes, no weakness, no numbness, no tingling, happening daily an worsening acutely. She has low back pain and sciatica as well per patient.   Reviewed notes, labs and imaging from outside physicians, which showed ***  Reviewed blood work collected June/2024, BMP showed BUN 12 and creatinine 0.72, slightly elevated glucose and sodium and slightly reduced  CO2 but overall unremarkable, CBC normal, B12 702 normal, ferritin 28 low normal, folate 12.4 normal, RPR nonreactive  Review of Systems: Patient complains of symptoms per HPI as well as the following symptoms ***. Pertinent negatives and positives per HPI. All others negative.   Social History   Socioeconomic History   Marital status: Single    Spouse name: Not on file   Number of children: Not on file   Years of  education: Not on file   Highest education level: Not on file  Occupational History   Not on file  Tobacco Use   Smoking status: Never   Smokeless tobacco: Never   Tobacco comments:    Quit vaping with +preg  Vaping Use   Vaping status: Former  Substance and Sexual Activity   Alcohol use: Not Currently    Comment: socially   Drug use: Never   Sexual activity: Yes    Birth control/protection: Condom  Other Topics Concern   Not on file  Social History Narrative   Lives with her parents    Right handed   Caffeine: rarely, before pregnancy had Red Bull 3 days a week    Social Drivers of Corporate investment banker Strain: Low Risk  (11/06/2022)   Received from [facility]   Overall Financial Resource Strain (CARDIA)    Difficulty of Paying Living Expenses: Not very hard  Food Insecurity: No Food Insecurity (11/06/2022)   Received from [facility]   Hunger Vital Sign    Within the past 12 months, you worried that your food would run out before you got the money to buy more.: Never true    Within the past 12 months, the food you bought just didn't last and you didn't have money to get more.: Never true  Transportation Needs: No Transportation Needs (11/06/2022)   Received from [facility] - Transportation    Lack of Transportation (Medical): No    Lack of Transportation (Non-Medical): No  Physical Activity: Not on File (09/20/2021)   Received from [facility]   Physical Activity    Physical Activity: 0  Stress: Not on File (09/20/2021)   Received from [facility]   Stress    Stress: 0  Social Connections: Not on File (02/11/2023)   Received from [facility]   Social Connections    Connectedness: 0  Intimate Partner Violence: Unknown (09/07/2021)   Received from [facility]   HITS    Physically Hurt: Not on file    Insult or Talk Down To: Not on file    Threaten Physical Harm: Not on file    Scream or Curse: Not on file    Family History  Problem Relation Age of Onset    Diabetes Mother    Hypertension Mother    Diabetes Father    Migraines Neg Hx     Past Medical History:  Diagnosis Date   Anemia    Anxiety    Depression    hx of depression    Eczema    Headache    Low calcium levels     There are no active problems to display for this patient.   Past Surgical History:  Procedure Laterality Date   NO PAST SURGERIES      Current Outpatient Medications  Medication Sig Dispense Refill   cetirizine (ZYRTEC) 10 MG tablet Take 1 tablet by mouth daily.     FEROSUL 325 (65 Fe) MG tablet Take by mouth.  Prenatal Vit-Fe Fumarate-FA (PRENATAL MULTIVITAMIN) TABS tablet Take 1 tablet by mouth daily at 12 noon.     No current facility-administered medications for this visit.    Allergies as of 11/21/2023 - Review Complete 11/21/2023  Allergen Reaction Noted   Aleve arthritis pain [diclofenac sodium] Rash 06/25/2022    Vitals: BP 130/82 (BP Location: Left Arm, Patient Position: Sitting, Cuff Size: Normal)   Pulse 94   Ht 5' 3 (1.6 m)   Wt 201 lb (91.2 kg)   LMP 09/13/2023 (Exact Date)   BMI 35.61 kg/m  Last Weight:  Wt Readings from Last 1 Encounters:  11/21/23 201 lb (91.2 kg)   Last Height:   Ht Readings from Last 1 Encounters:  11/21/23 5' 3 (1.6 m)     Physical exam: Exam: Gen: NAD, conversant, well nourised, obese, well groomed                     CV: RRR, no MRG. No Carotid Bruits. No peripheral edema, warm, nontender Eyes: Conjunctivae clear without exudates or hemorrhage  Neuro: Detailed Neurologic Exam  Speech:    Speech is normal; fluent and spontaneous with normal comprehension.  Cognition:    The patient is oriented to person, place, and time;     recent and remote memory intact;     language fluent;     normal attention, concentration,     fund of knowledge Cranial Nerves:    The pupils are equal, round, and reactive to light. The fundi are normal and spontaneous venous pulsations are present. Visual  fields are full to finger confrontation. Extraocular movements are intact. Trigeminal sensation is intact and the muscles of mastication are normal. The face is symmetric. The palate elevates in the midline. Hearing intact. Voice is normal. Shoulder shrug is normal. The tongue has normal motion without fasciculations.   Coordination:    Normal finger to nose and heel to shin. Normal rapid alternating movements.   Gait:    Heel-toe and tandem gait are normal.   Motor Observation:    No asymmetry, no atrophy, and no involuntary movements noted. Tone:    Normal muscle tone.    Posture:    Posture is normal. normal erect    Strength:    Strength is V/V in the upper and lower limbs.      Sensation: intact to LT     Reflex Exam:  DTR's:    Deep tendon reflexes in the upper and lower extremities are normal bilaterally.   Toes:    The toes are downgoing bilaterally.   Clonus:    Clonus is absent.    Assessment/Plan:    Sounds like irritating the occipital nerve, getting occipital headache with vomiting, recently pregnant. Possibly due to neck straining/increased thoracic pressure when vomiting. Would image the brain   No orders of the defined types were placed in this encounter.  No orders of the defined types were placed in this encounter.   Cc: Leanora Prophet, [place], PA-C  Aldona Amel, MD  [facility] [address] [place], [place] [address]  Phone [phone] Fax [phone]

## 2023-11-21 NOTE — Patient Instructions (Addendum)
 Ask OBGYN about treating the vomiting May be occipital nerve irritation(see below) with increased pressure when vomiting or neck posture.  MRI of the brain  Woudn;t  treat with medications as this only happens when vomiting and the vomiting likely due to 1st trimester, focus on supporting the neck when vomiting and talking to obgyn about nausea/vomiting prevention in pregnancy Hydrate with electrolytes  Vomiting, Adult Vomiting is when stomach contents forcefully come out of the mouth. Many people notice nausea before vomiting. Vomiting can make you feel weak and cause you to become dehydrated. Dehydration can make you feel tired and thirsty, cause you to have a dry mouth, and decrease how often you urinate. Older adults and people who have other diseases or a weak body defense system (immune system) are at higher risk for dehydration. It is important to treat vomiting as told by your health care provider. Follow these instructions at home:  Watch your symptoms for any changes. Tell your health care provider about them. Eating and drinking     Follow these recommendations as told by your health care provider: Take an oral rehydration solution (ORS). This is a drink that is sold at pharmacies and retail stores. Eat bland, easy-to-digest foods in small amounts as you are able. These foods include bananas, applesauce, rice, lean meats, toast, and crackers. Drink clear fluids slowly and in small amounts as you are able. Clear fluids include water, ice chips, low-calorie sports drinks, and fruit juice that has water added (diluted fruit juice). Avoid drinking fluids that contain a lot of sugar or caffeine, such as energy drinks, sports drinks, and soda. Avoid alcohol. Avoid spicy or fatty foods.  General instructions Wash your hands often using soap and water for at least 20 seconds. If soap and water are not available, use hand sanitizer. Make sure that everyone in your household washes their  hands frequently. Take over-the-counter and prescription medicines only as told by your health care provider. Rest at home while you recover. Watch your condition for any changes. Keep all follow-up visits. This is important. Contact a health care provider if: Your vomiting gets worse. You have new symptoms. You have a fever. You cannot drink fluids without vomiting. You feel light-headed or dizzy. You have a headache. You have muscle cramps. You have a rash. You have pain while urinating. Get help right away if: You have pain in your chest, neck, arm, or jaw. Your heart is beating very quickly. You have trouble breathing or you are breathing very quickly. You feel extremely weak or you faint. Your skin feels cold and clammy. You feel confused. You have persistent vomiting. You have vomit that is bright red or looks like black coffee grounds. You have stools (feces) that are bloody or black, or stools that look like tar. You have a severe headache, a stiff neck, or both. You have severe pain, cramping, or bloating in your abdomen. You have signs of dehydration, such as: Dark urine, very little urine, or no urine. Cracked lips. Dry mouth. Sunken eyes. Sleepiness. Weakness. These symptoms may be an emergency. Get help right away. Call 911. Do not wait to see if the symptoms will go away. Do not drive yourself to the hospital. Summary Vomiting is when stomach contents forcefully come out of the mouth. Vomiting can cause you to become dehydrated. It is important to treat vomiting as told by your health care provider. Follow your health care provider's instructions about eating and drinking. Wash your hands often using  soap and water for at least 20 seconds. If soap and water are not available, use hand sanitizer. Watch your condition for any changes and for signs of dehydration. Keep all follow-up visits. This is important. This information is not intended to replace advice  given to you by your health care provider. Make sure you discuss any questions you have with your health care provider. Document Revised: 11/24/2020 Document Reviewed: 11/24/2020 Elsevier Patient Education  2024 [employer].

## 2023-11-23 ENCOUNTER — Encounter: Payer: Self-pay | Admitting: Neurology

## 2023-11-23 DIAGNOSIS — R519 Headache, unspecified: Secondary | ICD-10-CM | POA: Insufficient documentation

## 2023-11-23 DIAGNOSIS — Z3491 Encounter for supervision of normal pregnancy, unspecified, first trimester: Secondary | ICD-10-CM | POA: Insufficient documentation

## 2023-11-26 ENCOUNTER — Telehealth: Payer: Self-pay | Admitting: Neurology

## 2023-11-26 NOTE — Telephone Encounter (Signed)
 The MRI brain shara is pending medical review case #[id number] Anticipated Determination Date: 12/04/2023  MRA head Healthy [place]: [id number] exp. 11/26/23-01/24/24 sent to GI [phone]

## 2023-11-27 NOTE — Telephone Encounter (Signed)
 MRI brain Healthy [place] shara: [id number] exp. 11/26/23-01/24/24

## 2023-12-03 DIAGNOSIS — Z0289 Encounter for other administrative examinations: Secondary | ICD-10-CM

## 2023-12-04 ENCOUNTER — Telehealth: Admitting: *Deleted

## 2023-12-04 DIAGNOSIS — O99212 Obesity complicating pregnancy, second trimester: Secondary | ICD-10-CM | POA: Insufficient documentation

## 2023-12-04 DIAGNOSIS — O99210 Obesity complicating pregnancy, unspecified trimester: Secondary | ICD-10-CM | POA: Insufficient documentation

## 2023-12-04 DIAGNOSIS — Z3400 Encounter for supervision of normal first pregnancy, unspecified trimester: Secondary | ICD-10-CM | POA: Diagnosis not present

## 2023-12-04 MED ORDER — GOJJI WEIGHT SCALE MISC: 1.0000 | Freq: Once | 0 refills | Status: DC

## 2023-12-04 MED ORDER — GOJJI WEIGHT SCALE MISC: 1.0000 | Freq: Once | 0 refills | Status: AC

## 2023-12-04 NOTE — Progress Notes (Signed)
 New OB Intake  I connected with Catherine Lawrence  on 12/04/23 at 10:15 AM EDT by MyChart Video Visit and verified that I am speaking with the correct person using two identifiers. Nurse is located at [facility] and pt is located at home.  I discussed the limitations, risks, security and privacy concerns of performing an evaluation and management service by telephone and the availability of in person appointments. I also discussed with the patient that there may be a patient responsible charge related to this service. The patient expressed understanding and agreed to proceed.  I explained I am completing New OB Intake today. We discussed EDD of 06/27/2024, by Ultrasound. Pt is G1P0. I reviewed her allergies, medications and Medical/Surgical/OB history.    Patient Active Problem List   Diagnosis Date Noted   Obesity affecting pregnancy, antepartum 12/04/2023   Supervision of low-risk first pregnancy, unspecified trimester 12/04/2023   Episodic headache 11/23/2023     Concerns addressed today  Delivery Plans Plans to deliver at [facility]. Discussed the nature of our practice with multiple providers including residents and students. Due to the size of the practice, the delivering provider may not be the same as those providing prenatal care.   Patient undecided if interested in water birth.  MyChart/Babyscripts MyChart access verified. I explained pt will have some visits in office and some virtually. Babyscripts instructions given and order placed. Patient verifies receipt of registration text/e-mail. Account successfully created and app downloaded. If patient is a candidate for Optimized scheduling, add to sticky note.   Blood Pressure Cuff/Weight Scale Blood pressure cuff ordered for patient to pick-up from [employer]. Explained after first prenatal appt pt will check weekly and document in Babyscripts. Patient does not have a reliable weight scale; scale ordered to be picked up from  [employer] as well.  Anatomy US  Explained first scheduled US  will be around 19 weeks. Anatomy US  scheduled for 02/05/2024 at 10:30 am.  Is patient a CenteringPregnancy candidate?  Declined Declined due to Enrolled in [facility]   Is patient a Mom+Baby Combined Care candidate?  Accepted   If accepted, confirm patient does not intend to move from the area for at least 12 months, then notify Mom+Baby staff  Is patient a candidate for Babyscripts Optimization? Yes, patient accepted    First visit review I reviewed new OB appt with patient. Explained pt will be seen by Delilah Cedar, CNM at first visit. Discussed [place] genetic screening with patient. Patient would like to have Panorama and Horizon drawn with routine prenatal labsat new OB visit.  Last Pap No results found for: DIAGPAP  Catherine Rosina Garre, RN 12/04/2023  4:44 PM

## 2023-12-10 ENCOUNTER — Other Ambulatory Visit

## 2023-12-11 ENCOUNTER — Ambulatory Visit: Payer: Self-pay | Admitting: Certified Nurse Midwife

## 2023-12-11 ENCOUNTER — Other Ambulatory Visit: Payer: Self-pay

## 2023-12-11 ENCOUNTER — Other Ambulatory Visit (HOSPITAL_COMMUNITY)
Admission: RE | Admit: 2023-12-11 | Discharge: 2023-12-11 | Disposition: A | Discharge status: Home / Self Care | Source: Ambulatory Visit | Attending: Certified Nurse Midwife | Admitting: Certified Nurse Midwife

## 2023-12-11 ENCOUNTER — Encounter: Payer: Self-pay | Admitting: Certified Nurse Midwife

## 2023-12-11 VITALS — BP 120/79 | HR 87 | Wt 204.6 lb

## 2023-12-11 DIAGNOSIS — Z3401 Encounter for supervision of normal first pregnancy, first trimester: Secondary | ICD-10-CM | POA: Insufficient documentation

## 2023-12-11 DIAGNOSIS — O26891 Other specified pregnancy related conditions, first trimester: Secondary | ICD-10-CM

## 2023-12-11 DIAGNOSIS — Z3400 Encounter for supervision of normal first pregnancy, unspecified trimester: Secondary | ICD-10-CM

## 2023-12-11 DIAGNOSIS — Z3A11 11 weeks gestation of pregnancy: Secondary | ICD-10-CM

## 2023-12-11 DIAGNOSIS — O219 Vomiting of pregnancy, unspecified: Secondary | ICD-10-CM | POA: Diagnosis not present

## 2023-12-11 DIAGNOSIS — R109 Unspecified abdominal pain: Secondary | ICD-10-CM | POA: Diagnosis not present

## 2023-12-11 DIAGNOSIS — Z1332 Encounter for screening for maternal depression: Secondary | ICD-10-CM | POA: Diagnosis not present

## 2023-12-11 MED ORDER — ONDANSETRON 4 MG PO TBDP: 4.0000 mg | ORAL_TABLET | Freq: Three times a day (TID) | ORAL | 3 refills | Status: DC | PRN

## 2023-12-11 NOTE — Patient Instructions (Signed)
 [facility] Pediatric Providers  Central/Southeast  ([address]) [facility] Manson Passey, MD; Deirdre Priest, MD; Lum Babe, MD; Leveda Anna, MD; McDiarmid, MD; Jerene Bears, MD [address] [place], [place] [address] [phone] Mon-Fri 8:30-12:30, 1:30-5:00  Providers come to see babies during newborn hospitalization Only accepting infants of Mother's who are seen at [facility] or have siblings seen at   [facility] Medicaid - Yes; Tricare - Yes   [facility] [place], MD [address], [place], [place] [address] [phone] Mon, Tue, Thur, Fri 8:30-5:00, Wed 10:00-7:00 (closed 1-2pm daily for lunch) [facility] residents with no insurance.  [place] [employer] only with Medicaid/insurance; Tricare - no  [facility] - [facility] [place] [facility], MD; Manson Passey, MD; Ave Filter, MD; Luna Fuse, MD; Kennedy Bucker, MD; Florestine Avers, MD; Melchor Amour, MD; Yetta Barre,  MD; Konrad Dolores, MD; Kathlene November, MD; Jenne Campus, MD; Wynetta Emery, MD; Duffy Rhody, MD; Gerre Couch, NP [address] [place], [place], [place] [address] [phone] Mon, Tue, Thur, Fri 8:30-5:30, Wed 9:30-5:30, Sat 8:30-12:30 Only accepting infants of first-time parents or siblings of current patients Hospital discharge coordinator will make follow-up appointment Medicaid - yes; Tricare - yes  [place] [phone]) [facility] Ilean China, MD; Earlene Plater, MD; Jamesetta Orleans, MD; Alvera Novel, MD; Rana Snare, MD; [facility], MD; Shaaron Adler, MD; Hosie Poisson, MD; Mayford Knife, MD [address], [place], [place] [address] [phone] Mon-Fri 8:30-5:00, closed for lunch 12:30-1:30; Sat-Sun 10:00-1:00 Accepting Newborns with commercial insurance only, must call prior to delivery to be accepted into  practice.  Medicaid - no, Tricare - yes   [facility] [address] [place], [place] [address] [phone] or [phone] Mon to Fri 8am to 10pm, Sat 8am to 1pm  (virtual only on weekends) Only accepts Medicaid Healthy Blue pts  [facility]) - Pediatrics at Elige Radon, MD; Sabino Dick, MD; Quitman Livings, MD; Betha Loa, NP; Claretha Cooper, MD; Lelon Perla, MD [address] [place] [place], [place] [address] [phone] Mon-Fri 8:30-5:30 Medicaid - yes, Tricare - yes  [place] [phone]) [facility] Marcie Mowers, MD [address], [place], [place] [address] [phone] Iona Hansen, Wed Fri 8:30-5:00, Sat 8:30-12:00, Closed Thursdays Accepting siblings of established patients and first time mom's if you call prenatally Medicaid- yes; Tricare - yes  [facility] Lutricia Feil, [place]; Tracie Harrier, MD; Rusty Aus; Scifres, PA; Wynelle Link, MD; Azucena Cecil, MD;  [address], [place], [place] [address] [phone] Mon-Fri 8:30-5:00, closed for lunch 1-2 Only accepting newborns of established patients Medicaid- no; Tricare - yes  [facility] [phone]) [facility] Morene Crocker, MD; [address], [place], [place] [address] [phone] Mon-Fri 8:00-5:00 Medicaid - No; Tricare - Yes  [facility], [place]; [place], [place] [address] [place], [place] [address] [phone] Mon-Fri 8:00-5:00 Medicaid - No, Tricare - Yes  [facility] Cardell Peach, MD; Nash Dimmer, MD; [place], [place] [address] [place], [place] [address] [phone]  Mon-Fri 8:00-5:00 Medicaid - No; Tricare - Yes  [facility] [address], [place], [place] [address] [phone] Mon-Fri 8:30-5:00 (lunch 12:00-1:00) Medicaid -Yes; Tricare - Yes  [facility] [place], MD [address] [place], [place], [place] [address] [phone] Mon-Fri 8:00-5:00 Seeing newborns of current patients only. No new patients Medicaid - No, Tricare - yes  Nature conservation officer at [place] [address], MD [address] [place], [place] [address] [phone] Mon-Fri 8:00-5:00 Medicaid -yes as secondary coverage only;  Tricare - yes  [facility] [place], [place]; [place], [place]; Avis Epley, MD; Vonna Kotyk, MD; Clance Boll, MD; [place], [place]; Smoot, NP; Vaughan Basta, MD; Damon, MD [address] [place], [place] [address] [phone] Mon-Fri 8:30-5:00, Sat 9:00-11:00 Accepts commercial insurance ONLY. Offers free prenatal information sessions for families. Medicaid - No, Tricare - Call first  [facility] [place], MD; [place], [place]; [place], [place]; [place], [place] [address] [place] [address] [phone] Mon-Fri 7:30-5:30 Medicaid - Yes; Ailene Rud yes  [place] [phone] & [phone])  [facility], MD [address], [place], [place] [address] [phone] Mon-Thur 8:00-6:00, closed for lunch 12-2, closed Fridays Medicaid - yes; Tricare - no  [facility] Dareen Piano, NP; Cyndia Bent, MD; [place], [place]; [place], [place] [address], [place], [place] [address] [phone] Mon-Fri 7:30-4:30 Medicaid - yes, Tricare - yes  [facility]  Juanito Doom, MD; Janene Harvey, NP; Vonita Moss, MD; Donn Pierini, NP [address], [place], [place] [address] [phone] Mon-Fri 8:30-5:00, closed for lunch 1-2, Sat 8:30-12:00 - sick visits only Providers come to see babies at [facility] Only accepting newborns of siblings and first time parents ONLY if who have met with office prior to delivery Medicaid -Yes; Tricare - yes  [facility] - [place], [place]; Spero Geralds, NP; Earlene Plater, MD; Lucretia Roers, MD:  [address], [place], [place] [address] [phone] Mon- Fri 8:00-5:00, Sat 9:00-12:00 - sick visits only Accepting siblings of established patients and first time mom/baby Medicaid - Yes; Tricare - yes Patients must have vaccinations (baby vaccines)  [place] [phone] &  [phone])  Adult nurse Health[facility] at [facility] [address] [place], [place] [address] [phone] Mon-Fri 8:00-5:00 Medicaid - no; Tricare - yes  [facility] Hardin, MD; [place], [place]; [place], [place] [address], [place], [place] [address] [phone] Mon-Fri 8:00-5:00 Medicaid- yes; Tricare - yes  [facility] - Ardeen Jourdain, MD; Yetta Barre, NP; [place], [place] [address] [place], [place], [place] [address] [phone] Mon-Fri 8:00-5:00 Medicaid - Yes; Tricare - yes  [address] [place] [phone])  [facility] [place], [place]; [place], [place]; Eddie Candle, MD; Normand Sloop, MD; [place], NP; [place]enhour, DO; [place], [place]; Constance Goltz, MD; Ruthann Cancer, MD; Vear Clock, MD; [place], [place]; [place], [place]; [place], [place] [address] [facility] [address], [place], [place] [address] [phone] Mon-Fri 8:30-5:00, Sat 9:00-12:00 - sick only Please register online [email or web address] then schedule online or call office Medicaid-Yes; Tricare -yes  [facility] - Premier  D[place], MD; Romualdo Bolk, MD; [place], MD; [place], NP; [place], [place]; Antonietta Barcelona, MD; Mayford Knife, NP; Shelva Majestic, MD [address], [place], [place] [address] [phone] Mon-Fri 8:00-5:30, Sat&Sun by appointment (phones open at 8:30) Medicaid - Yes; Tricare - yes  [place] [phone] & [phone]) [facility] Mariel Aloe; [place], MD; Roger Shelter, MD; Arvilla Market, NP; [place], DO [address], [place], [place] [address] [phone] M-F 8:00 - 5:15, Sat/Sun 9-12 sick visits only Medicaid - No; Tricare - yes  [facility]  [place], PA-C; [place], PA-C; [place], DO; [place], PA-C; [place], PA-C; Roselyn Bering, MD [address], [place], [place] [address] [phone] Mon-Thur 8:00-7:00, Fri 8:00-5:00 Accepting Medicaid for 13 and under only   [facility] Family Medicine  at [place] [facility] [place]) [place], [place]; List, FNP; Berneda Rose, MD; [place], PA-C; Scholer,  MD; Kellie Simmering, FNP; Genevie Cheshire, FNP; Evaristo Bury, MD; Berneda Rose, MD [phone] [address] [place], [place] [address] [phone] Mon-Fri 8:30-5:30 Medicaid - Yes; Tricare - yes  [facility] - [address]  [place], [place]; Whitney Post, MD; Hennie Duos, MD; Wynne Dust, MD; Hunter, NP [address], [place], [place] [address] [phone] Mon-Thur 8:00-5:30, Fri 8:00-5:00, Sat 9:00-12:00 Medicaid - yes, Tricare - yes  [place] [phone])  [facility], [place]; Lenise Arena, MD; [place], [place] [address], [place], [place] [address] [phone] Mon-Fri 8:00-5:00, closed for lunch 12-1 Medicaid - No; Tricare - yes  Nature conservation officer at [facility], MD [address] [place], [place] [address] [phone] Mon-Fri 8:00-5:00 Medicaid - No; Tricare - yes  [facility], MD; Tami Ribas, MD; Mariam Dollar, MD; Yetta Barre, MD [address], [place], [place] [address] [phone] Mon-Fri 8:00-5:00 Medicaid- Yes; Tricare - yes  [place] [phone])  Adult nurse HealthCare at [facility], [place]; [place], MD [address] [place], [place], [place] [address] [phone] Mon-Fri 8:00-5:00 Medicaid - No; Tricare - yes  [facility] Family Medicine - Whitney Post - CPNP [address] [place], [place], [place] [address] [phone] Mon-Weds 8:00-6:00, Thurs-Fri 8:00-5:00, Sat 9:00-12:00 Medicaid - yes; Tricare - yes   [facility] Katharina Caper, MD; [place], [place] [address] [place], [place] [address] [phone] Mon-Fri 8:00-5:00 Medicaid - yes; Tricare - yes  [facility] Pediatric Providers  [facility] [address], [place], [place] [address] [phone] Sheral Flow: 8am -8pm, Tues, Weds: 8am - 5pm; Fri: 8-1 Medicaid - Yes; Tricare -  yes  [facility] Rachel Bo, MD; Laural Benes, MD; Anner Crete, MD; [place], [place]; [place], [place] [address], [place], [place] [address] [phone] M-F 8:30 - 5:00 Medicaid - Call office; Tricare -yes  [facility] Edson Snowball, MD; Shanon Rosser, MD, Chelsea Primus, MD; Shirlyn Goltz, PNP; Wardell Heath, NP [phone] [address] [place], [place] [address] [phone] M-F 8:30 - 5:00, Sat/Sun 8:30 - 12:30 (sick visits) Medicaid - Call office; Tricare -yes  [facility] Melvyn Neth, MD; Karl Luke, PNP; Princess Bruins, MD; [place], [place]; [place], NP; Cynda Familia [address], [place], [place] [address] [phone] M-F 8:30 - 5:00 Medicaid - Call office; Tricare - yes  [facility] Jesusita Oka, MD; Dierdre Highman, MD; Earnest Conroy, MD; Timothy Lasso, MD; Nogo, MD [phone] [address], [place], [place] [address] [phone] M-Thur: 8:00 - 5:00; Fri: 8:00 - 4:00 Medicaid - yes; Tricare - yes  [facility] [address] [place], [place] [address] [phone] M-F: 8:30- 5:00, closed for lunch 12:30 - 1:00 Medicaid - yes; Tricare -yes  [facility] [address] [place], [place] [address] [phone] M-F 8:00 - 5:00 Medicaid - yes; Tricare - yes  [facility] [place], DO; Berkey, DO; Sheldon, NP [address] [place], [place] [address] [phone] M-F 8:00 - 5:00, Closed 12-1 for lunch Medicaid - Call; Tricare - yes  [facility] Meredith Mody, MD [address], [place], [place] [address] [phone] M-F: 8:00-5:00, Sat: 8:00 - noon Medicaid - call; Tricare -yes  [facility] Pediatric Providers  [facility] [place], [place] [address] [place], [place], [place] [address] [phone] M-W: 8:00-5:00, Thur: 8:00 - 7:00, Fri: 8:00 - noon Medicaid - yes; Tricare - yes  [facility] - Quay Burow, FNP [address], [place],  [place] [address] [phone] M-F 8:00 - 5:00, Closed for lunch 12-1 Medicaid -  yes; Tricare - yes  [facility] Pediatric Providers  [facility] at [place], [place], Alinda Money, MD, [place], FNP-C [address], [facility], [address], [place], [place] [address] [phone] M-T 8:00-5:00, Wed-Fri 7:00-6:00 Medicaid - Yes; Tricare -yes  [facility] at [facility], [place]; [address], [address] Saner [place], [place] [address] [phone] M-F 8:00 - 5:00, closed for lunch 12-1 Medicaid - Yes; Tricare - yes  [facility]  Zachery Dauer, MD; Gladstone Lighter, MD; Collie Siad, MD; Freda Jackson, MD; Rich Number, MD; Darryl Nestle, MD; Melinda Crutch, MD, Audria Nine, MD; Tawanna Cooler, MD; Steffanie Dunn, MD; Byrd Hesselbach, MD; Lucretia Roers, MD [address] [place] [address], [place], [place] [address] [phone] M-F 8:00-5:00 Medicaid - yes; Tricare - yes  [facility] [place], MD (speaks [place] and Hindi) [address] [place] [address] [place], [place] [address] [phone] M-F: 8:30 - 5:00, closed 12:30 - 1 for lunch Medicaid - Yes; Tricare -yes  [facility] Pediatric Providers  [facility] Shanda Bumps, MD; Chanetta Marshall, MD; Laurell Josephs, MD [address], [place], [place] [address] [phone] M-Th: 8:00 - 5:30, Fri: 8:00 - 12:00 Medicaid - yes; Tricare - yes  [facility] at [facility], NP; Thora Lance, MD; Orrin Brigham, MD [phone] [address] [place], [place] [address] [phone] M-F: 8:00 - 5:00 Medicaid - yes; Tricare - yes  [facility] [place], NP; Orson Slick, NP; Salley Scarlet, NP; Linton Flemings, MD; Mayford Knife, MD, [place], NP, Emelda Fear, MD; Nida Boatman [address] [place], [place] [address] [phone] M-F: 8:30 - 5:30p Medicaid - yes; Tricare - yes Other locations available as well  [facility], MD; Andrey Campanile, MD; Neville Route, PA-C [address] [place], [place] [address] [phone] M-W: 8:00am - 7:00pm, Thurs: 8:00am - 8:00pm; Fri: 8:00am -  5:00pm, closed daily from 12-1 for lunch Medicaid - yes; Tricare - yes  [facility] Pediatric Providers  [facility] at Levin Erp, MD; Aggie Cosier, FNP; Bland Span, MD; Tristan Schroeder, MD; [place], PNP; Alesia Banda; [place], [place]; Julian Reil, MD;  [address], [place], [place] [address] [phone] Judie Petit - Caleen [place]: 8am - 5pm, Sat 9-noon Medicaid - Yes; Tricare -yes  [facility] at Jaclynn Guarneri, MD; Yetta Barre, FNP; Lilian Kapur, MD; Mariam Dollar, MD [address] [place], [place] [phone] M-F 8:00 - 5:00 Medicaid - call; Tricare - yes  [facility]- Cruz Condon, MD; [place], [place]; Delora Fuel, MD; Dareen Piano, MD; Trudee Grip, MD; Kizzie Ide, MD; Zebedee Iba; Birdena Crandall, MD; Hinton Dyer, MD; [place], MD [address], [place], [place] [address] [phone] M-F 8:00am - 5:00pm; Sat. 9:00 - 11:00 Medicaid - yes; Tricare - yes  [facility] at [facility], MD [address] [place], [place] [address] [phone] M-F 8:00 - 5:00 Medicaid - [place] Medicaid only; Tricare - yes  [facility] - Illene Bolus, MD; Earlene Plater, [place]; Kenyon Ana, MD [address] [place], [place] [address] [phone] M-F 8:00 - 5:00 Medicaid - yes; Tricare - yes  [facility] [address] Pediatrics - Lind Covert, MD; Manson Passey, MD, [facility], MD, [place], MD; [place], MD; Katrinka Blazing, MD; [address] [place], [place] [address] [phone] M-F: 8-5 Medicaid - yes; Tricare - yes  [facility] - Henrietta Hoover, [place]; [place], MD; [address] [place], [place] [address] [phone] M-F 8-5 Medicaid - yes; Tricare - yes  [address] [place] Darrol Poke, MD; Tami Ribas, MD;  [place], MD; [place], DNP; [place], PNP [address], [place], [place] [address] [phone] M-F 8-5 Medicaid - yes; Tricare - yes  [facility] Delories Heinz, MD;  Adrienne Mocha; Ala Bent, MD [address] [place], [place] [address] [phone] M-F 7am - 5 pm Medicaid - call; Tricare - yes  [facility] [place], [place]; Fredia Beets, MD; Roxan Hockey, MD [address] [place], [place] [address] [phone] M-F 8-5 Medicaid - yes; Tricare - yes  [facility] Kae Heller, MD; Sheliah Hatch, MD; Mayford Knife, FNP; Shon Baton, FNP; Tyron Russell, FNP; Ishmael Holter; [facility] - FNP [address] [place], [place] [address] [phone] M-F 8-5 Medicaid- yes; Tricare - yes  [facility] - Betsy Coder, [place]ively and Chalmers Guest, MD; Terrial Rhodes, MD; Hulda Humphrey, MD; Roseanne Reno, MD; [place], H. [place]; Ala Dach, MD; Fredia Beets, MD; Dimple Casey, MD [address] [place], [place] [address] [phone] M-F: 8-5, Sat: 9-4, Sun 9-12 Medicaid - yes; Tricare - yes  [facility] Little, PNP; Earlene Plater, PNP [address] [place], [place] [address] [phone] M-F 8 - 5, closed 12-1 for lunch Medicaid - yes; Tricare - yes  [facility] Kathyrn Lass, MD; Hal Neer, MD; Dimple Casey, MD; [place], DO [address] [place], [place] [address] [phone] M-F 8- 5:30 Medicaid - yes; Tricare - yes  [facility] - Biagio Quint, MD; Rosalia Hammers, MD; Gwenith Daily, MD [address] [place], [place] [address] [phone] Judie Petit: Nicholas Lose; Tues-Fri: 8-5; Sat: 9-12 Medicaid - yes; Tricare - yes  [facility] - Bobbye Morton, MD; Daphane Shepherd, MD; Chestine Spore, MD; Haskell Riling, MD; Kate Sable, MD [address] [place], [place] [address] [phone] Judie PetitMarland Kitchen Nicholas LoseFrancee Nodal: 8-5; Sat: 8:30-12:30 Medicaid - yes; Tricare - yes  [facility] - Beckey Rutter, MD; [place], [place] [address] [place], [place] [address] [phone] Mon-Fri: 8-5 Medicaid - yes;  Tricare - yes  [facility] - [place], CPNP; [place], ; Dimple Casey, MD; Alisa Graff, MD; Cephus Shelling, MD; [address], [place], [place] [address] [phone] M-F: 8-5, closed 1-2 for lunch Medicaid - yes; Tricare - yes  [facility] - [place] [facility] [place], [place]; [place], [place]; Katrinka Blazing, MD; [place], CPNP; [place], [place]; [place], MD; Earlene Plater, MD [address], [place], [place] [address] [phone] M-Thurs: Nicholas Lose; Fri: 8-6; Sat: 9-12; Sun 2-4 Medicaid - yes; Tricare - yes  [facility] Georgeanna Lea, MD; Evette Cristal, MD; Shea Stakes, FNP; Earney Mallet, DO; [address] [place], [place] [address] [phone] M-F: 8-5 Medicaid - yes; Tricare - yes  [facility] Pediatric Providers  [facility] Family Medicine -Collene Mares, MD; [place], NP [address] [place], [place] [address] [phone] M - Fri: 8am - 5pm, closed for lunch 12-1 Medicaid - Yes; Tricare - yes  [facility] Elinor Parkinson, MD; Victory Dakin, MD; [place], DO; Vinocur, MD;Hall, PA; Clent Ridges, [place]; Orvan Falconer, NP [phone] [address], [place], [place] [address] [phone] M-F 8:00 - 5:00, Sat 8:00 - 11:30 Medicaid - yes; Tricare - yes  [facility] Welton Flakes, MD; [place], MD, [address] MD, [place], MD, [place], MD; [place], NP; [place], [place];  [address], [place], [place] [address] [phone] M-F 8:10am - 5:00pm Medicaid - yes; Tricare - yes  [facility] Clifford, MD; [place], NP [address] [place], [place] [address] [phone] M-F 8:00 - 5:00 Medicaid - [place] Medicaid only; Tricare - yes  [facility] - Deep [address] [place], [place]; [place], NP [address], [place], [place] [address] [phone] M-F 8:00 - 5:00; Closed for lunch 12 - 1:00 Medicaid - yes;  Tricare - yes  [facility] Belva Crome, MD; Jonita Albee, FNP [address], [place], [place] [address] [phone] Mon 9-5; Tues/Wed 10-5; Thurs 8:30-5; Fri: 8-12:30 Medicaid - yes; Tricare - yes  [facility] Pediatric Providers  [facility]  [place]al, MD; [place], [place] [address] [place], [place] [address] [phone] phone [phone] fax M-F 7:15 - 4:30 Medicaid - yes; Tricare - yes  [place] [facility] Karilyn Cota, MD; [place], DO [address] [place], [place] [address] [phone] M-Fri: 8:30 - 5:00, closed for lunch everyday noon - 1pm Medicaid - Yes; Tricare - yes  [facility] Burdine, MD; Reuel Boom, MD; Dimas Aguas, MD; Neita Carp, MD; [place], [place]; Bonnita Nasuti, [place]; [place], [place]; [place], [place]; [place], [place] [address] [place], [place] [address] [phone] M-Thurs: 7:30am - 7:00pm; Friday 7:30am - 4pm; Sat: 8:00 - 1:00 Medicaid - Yes; Tricare - yes  [place] [facility] Morton, MD; Conni Elliot, MD; Carroll Kinds, MD; [place], DO [address], [place], [place] [address] [phone] M-Thur: 8:00 - 5:00, Fri: 8:00 - Noon Medicaid - yes; Tricare - yes No [place] [facility]  [facility] [facility] Dettinger, MD; Nadine Counts, DO; [place], NP; Daphine Deutscher, NP; Lequita Halt, NP; Ellamae Sia, NP; Reginia Forts, NP; Darlyn Read, MD; [place], [place] [address] [place], [place] [address] [phone] M-F 8:00 - 5:00 Medicaid - yes; Tricare - yes  [facility], FNP-C; [place], FNP-C [address] [place], [place] [address] [phone] M, W, R 8:00-5:00, Tues: 8:00am - 7:00pm; Fri 8:00 - noon Medicaid - Yes; Tricare - yes  [facility], MD [address] [place], [place] [address] [phone]  M-Thurs 8:30-5:30, Fri: 8:30-12:30pm Medicaid - Yes; Tricare - N     Safe Medications in Pregnancy   Acne:  Benzoyl Peroxide  Salicylic Acid    Backache/Headache:  Tylenol: 2 regular strength every 4 hours OR               2 Extra strength every 6 hours   Colds/Coughs/Allergies:  Benadryl (alcohol free) 25 mg every 6 hours as needed  Breath right strips  Claritin  Cepacol throat lozenges  Chloraseptic throat spray  Cold-Eeze- up to three times per day  Cough drops, alcohol free  Flonase (by prescription only)  Guaifenesin  Mucinex  Robitussin DM (plain only, alcohol free)  Saline nasal spray/drops  Sudafed (pseudoephedrine) & Actifed * use only after [redacted] weeks gestation and if you do not have high blood pressure  Tylenol  Vicks Vaporub  Zinc lozenges  Zyrtec   Constipation:  Colace  Ducolax suppositories  Fleet enema  Glycerin suppositories  Metamucil  Milk of magnesia  Miralax  Senokot  Smooth move tea   Diarrhea:  Kaopectate  Imodium A-D   *NO pepto Bismol   Hemorrhoids:  Anusol  Anusol HC  Preparation H  Tucks   Indigestion:  Tums  Maalox  Mylanta  Zantac  Pepcid   Insomnia:  Benadryl (alcohol free) 25mg  every 6 hours as needed  Tylenol PM  Unisom, no Gelcaps   Leg Cramps:  Tums  MagGel   Nausea/Vomiting:  Bonine  Dramamine  Emetrol  Ginger extract  Sea bands  Meclizine  Nausea medication to take during pregnancy:  Unisom (doxylamine succinate 25 mg tablets) Take one tablet daily at bedtime. If symptoms are not adequately controlled, the dose can be increased to a maximum recommended dose of two tablets daily (1/2 tablet in the morning, 1/2 tablet mid-afternoon and one at bedtime).  Vitamin B6 100mg  tablets. Take one tablet twice a day (up to 200 mg per day).   Skin Rashes:  Aveeno products  Benadryl cream or 25mg  every 6 hours as needed  Calamine Lotion  1% cortisone cream   Yeast infection:  Gyne-lotrimin 7  Monistat 7    **If taking multiple medications, please check labels to avoid duplicating the same active ingredients  **take medication as directed on the label   ** Do not exceed 4000 mg of tylenol in 24 hours  **Do not take medications that contain aspirin or ibuprofen

## 2023-12-11 NOTE — Progress Notes (Signed)
 INITIAL PRENATAL VISIT  History:  Catherine Lawrence is a 23 y.o. G1P0 at [redacted]w[redacted]d by early ultrasound being seen today for her first obstetrical visit.  Her obstetrical history is insignificant. Patient does intend to breast feed. Pregnancy history fully reviewed.  Patient reports headache. Reports that rest and tylenol usually resolves them   HISTORY: OB History  Gravida Para Term Preterm AB Living  1 0 0 0 0 0  SAB IAB Ectopic Multiple Live Births  0 0 0 0 0    # Outcome Date GA Lbr Len/2nd Weight Sex Type Anes PTL Lv  1 Current             Patient has not had a PAP smear and request to have it completed in PP   Past Medical History:  Diagnosis Date   Anemia    Anxiety    Depression    hx of depression    Eczema    Episodic headache 11/23/2023   Headache    Low calcium levels    Past Surgical History:  Procedure Laterality Date   NO PAST SURGERIES     Family History  Problem Relation Age of Onset   Diabetes Father    Diabetes Mother    Hypertension Mother    Hyperthyroidism Mother    Migraines Neg Hx    Social History   Tobacco Use   Smoking status: Never   Smokeless tobacco: Never   Tobacco comments:    Quit vaping with +preg  Vaping Use   Vaping status: Former   Substances: Nicotine  Substance Use Topics   Alcohol use: Not Currently    Comment: socially   Drug use: Never   Allergies  Allergen Reactions   Aleve Arthritis Pain [Diclofenac Sodium] Rash   Current Outpatient Medications on File Prior to Visit  Medication Sig Dispense Refill   cetirizine (ZYRTEC) 10 MG tablet Take 1 tablet by mouth daily.     FEROSUL 325 (65 Fe) MG tablet Take by mouth.     mometasone (ELOCON) 0.1 % ointment Apply topically as needed (for skin rash).     Prenatal Vit-Fe Fumarate-FA (PRENATAL MULTIVITAMIN) TABS tablet Take 1 tablet by mouth daily at 12 noon.     triamcinolone ointment (KENALOG) 0.1 % Apply topically as needed.     No current facility-administered  medications on file prior to visit.    Review of Systems Pertinent items noted in HPI and remainder of comprehensive ROS otherwise negative.  Indications for ASA therapy (per UpToDate) One of the following: (Needs 162 mg daily) Previous pregnancy with preeclampsia, especially early onset and with an adverse outcome No Chronic hypertension No Type 1 or 2 diabetes mellitus No Multifetal gestation No Chronic kidney disease No Autoimmune disease (antiphospholipid syndrome, systemic lupus erythematosus) No Two or more of the following: (Can do 81 mg daily) Nulliparity Yes Obesity (body mass index >30 kg/m2) Yes Family history of preeclampsia in mother or sister No Age >=35 years No Sociodemographic characteristics (African American race, low socioeconomic level) Yes Personal risk factors (eg, previous pregnancy with low birth weight or small for gestational age infant, previous adverse pregnancy outcome [eg, stillbirth], interval >10 years between pregnancies) No In vitro conception No  Physical Exam:   Vitals:   12/11/23 1012  BP: 120/79  Pulse: 87  Weight: 204 lb 9.6 oz (92.8 kg)   Fetal Heart Rate (bpm): 161    General: well-developed, well-nourished female in no acute distress  Skin: normal coloration and  turgor, no rashes  Neurologic: oriented, normal, negative, normal mood  Extremities: normal strength, tone, and muscle mass, ROM of all joints is normal  HEENT PERRLA, extraocular movement intact and sclera clear, anicteric  Neck supple and no masses  Cardiovascular: regular rate and rhythm  Respiratory:  no respiratory distress, normal breath sounds  Abdomen: soft, non-tender; bowel sounds normal; no masses,  no organomegaly  Pelvic: Deferred at patient request.    Assessment:  Pregnancy: G1P0 Patient Active Problem List   Diagnosis Date Noted   Obesity affecting pregnancy, antepartum 12/04/2023   Supervision of low-risk first pregnancy, unspecified trimester  12/04/2023   Episodic headache 11/23/2023    Plan:  1. Encounter for supervision of low-risk first pregnancy in first trimester (Primary) - Patient overall doing well.  - Has not felt fetal movement yet.  - Culture, OB Urine - CBC/D/Plt+RPR+Rh+ABO+RubIgG... - Hemoglobin A1c - PANORAMA PRENATAL TEST - HORIZON Basic Panel - GC/Chlamydia probe amp ([place])not at [facility]  2. [redacted] weeks gestation of pregnancy - Reviewed Fetal movement expectations of 1st and 2nd trimester.   4. Abdominal cramping - Reviewed that cramping can be a normal discomfort of pregnancy.  - Encouraged to stay hydrated especially in the summer months.   5. Nausea and vomiting during pregnancy - Rx for Zofran  sent to outpatient pharmacy.    Initial labs drawn. Continue prenatal vitamins. Problem list reviewed and updated. Genetic Screening discussed, Panorama and Horizon: ordered. Ultrasound discussed; fetal anatomic survey: scheduled. Anticipatory guidance about prenatal visits given including labs, ultrasounds, and testing. Weight gain recommendations per IOM guidelines reviewed: underweight/BMI 18.5 or less > 28 - 40 lbs; normal weight/BMI 18.5 - 24.9 > 25 - 35 lbs; overweight/BMI 25 - 29.9 > 15 - 25 lbs; obese/BMI 30 or more > 11 - 20 lbs. Discussed usage of the Babyscripts app for more information about pregnancy, and to track blood pressures. Also discussed usage of virtual visits as additional source of managing and completing prenatal visits.  Patient was encouraged to use MyChart to review results, send requests, and have questions addressed.   The nature of Center for [facility] Healthcare/Faculty Practice with multiple MDs and Advanced Practice Providers was explained to patient; also emphasized that residents, students are part of our team. Routine obstetric precautions reviewed. Encouraged to seek out care at our office or emergency room [facility] preferred) for urgent and/or emergent concerns. Return  in about 4 weeks (around 01/08/2024) for LOB.   Jatavius Ellenwood Erven) Emilio, MSN, CNM  Center for [facility]  12/11/2023 6:05 PM

## 2023-12-12 LAB — CBC/D/PLT+RPR+RH+ABO+RUBIGG...
Antibody Screen: NEGATIVE
Basophils Absolute: 0 x10E3/uL (ref 0.0–0.2)
Basos: 0 %
EOS (ABSOLUTE): 0.1 x10E3/uL (ref 0.0–0.4)
Eos: 1 %
HCV Ab: NONREACTIVE
HIV Screen 4th Generation wRfx: NONREACTIVE
Hematocrit: 36.5 % (ref 34.0–46.6)
Hemoglobin: 11.8 g/dL (ref 11.1–15.9)
Hepatitis B Surface Ag: NEGATIVE
Immature Grans (Abs): 0 x10E3/uL (ref 0.0–0.1)
Immature Granulocytes: 0 %
Lymphocytes Absolute: 2.2 x10E3/uL (ref 0.7–3.1)
Lymphs: 21 %
MCH: 28.5 pg (ref 26.6–33.0)
MCHC: 32.3 g/dL (ref 31.5–35.7)
MCV: 88 fL (ref 79–97)
Monocytes Absolute: 0.3 x10E3/uL (ref 0.1–0.9)
Monocytes: 3 %
Neutrophils Absolute: 8.2 x10E3/uL — ABNORMAL HIGH (ref 1.4–7.0)
Neutrophils: 75 %
Platelets: 284 x10E3/uL (ref 150–450)
RBC: 4.14 x10E6/uL (ref 3.77–5.28)
RDW: 15.7 % — ABNORMAL HIGH (ref 11.7–15.4)
RPR Ser Ql: NONREACTIVE
Rh Factor: POSITIVE
Rubella Antibodies, IGG: 2.99 {index} (ref >0.99)
WBC: 10.9 x10E3/uL — ABNORMAL HIGH (ref 3.4–10.8)

## 2023-12-12 LAB — GC/CHLAMYDIA PROBE AMP (~~LOC~~) NOT AT ARMC
Chlamydia: NEGATIVE
Comment: NEGATIVE
Comment: NORMAL
Neisseria Gonorrhea: NEGATIVE

## 2023-12-12 LAB — HCV INTERPRETATION

## 2023-12-12 LAB — HEMOGLOBIN A1C
Est. average glucose Bld gHb Est-mCnc: 111 mg/dL
Hgb A1c MFr Bld: 5.5 % (ref 4.8–5.6)

## 2023-12-13 LAB — URINE CULTURE, OB REFLEX

## 2023-12-13 LAB — CULTURE, OB URINE

## 2023-12-16 ENCOUNTER — Encounter: Payer: Self-pay | Admitting: Certified Nurse Midwife

## 2023-12-16 DIAGNOSIS — Z0289 Encounter for other administrative examinations: Secondary | ICD-10-CM

## 2023-12-18 LAB — PANORAMA PRENATAL TEST FULL PANEL:PANORAMA TEST PLUS 5 ADDITIONAL MICRODELETIONS: FETAL FRACTION: 4.5

## 2023-12-19 LAB — HORIZON CUSTOM: REPORT SUMMARY: NEGATIVE

## 2023-12-30 ENCOUNTER — Ambulatory Visit
Admission: RE | Admit: 2023-12-30 | Discharge: 2023-12-30 | Disposition: A | Discharge status: Home / Self Care | Source: Ambulatory Visit | Attending: Neurology

## 2023-12-30 DIAGNOSIS — Z3491 Encounter for supervision of normal pregnancy, unspecified, first trimester: Secondary | ICD-10-CM

## 2023-12-30 DIAGNOSIS — G4484 Primary exertional headache: Secondary | ICD-10-CM

## 2023-12-30 DIAGNOSIS — R51 Headache with orthostatic component, not elsewhere classified: Secondary | ICD-10-CM

## 2023-12-30 DIAGNOSIS — R519 Headache, unspecified: Secondary | ICD-10-CM

## 2023-12-30 DIAGNOSIS — G441 Vascular headache, not elsewhere classified: Secondary | ICD-10-CM

## 2024-01-05 ENCOUNTER — Ambulatory Visit: Payer: Self-pay | Admitting: Neurology

## 2024-01-08 ENCOUNTER — Other Ambulatory Visit: Payer: Self-pay

## 2024-01-08 ENCOUNTER — Encounter: Payer: Self-pay | Admitting: Obstetrics & Gynecology

## 2024-01-08 ENCOUNTER — Ambulatory Visit: Admitting: Obstetrics & Gynecology

## 2024-01-08 VITALS — BP 112/76 | HR 109 | Wt 209.0 lb

## 2024-01-08 DIAGNOSIS — Z3A15 15 weeks gestation of pregnancy: Secondary | ICD-10-CM

## 2024-01-08 DIAGNOSIS — Z3402 Encounter for supervision of normal first pregnancy, second trimester: Secondary | ICD-10-CM

## 2024-01-08 DIAGNOSIS — Z3400 Encounter for supervision of normal first pregnancy, unspecified trimester: Secondary | ICD-10-CM

## 2024-01-08 MED ORDER — ASPIRIN 81 MG PO TBEC: 81.0000 mg | DELAYED_RELEASE_TABLET | Freq: Every day | ORAL | 2 refills | Status: DC

## 2024-01-08 NOTE — Progress Notes (Signed)
   PRENATAL VISIT NOTE  Subjective:  Catherine Lawrence is a 23 y.o. G1P0 at [redacted]w[redacted]d being seen today for ongoing prenatal care.  She is currently monitored for the following issues for this low-risk pregnancy and has Episodic headache; Obesity affecting pregnancy, antepartum; and Supervision of low-risk first pregnancy, unspecified trimester on their problem list.  Patient reports no complaints.  Contractions: Not present. Vag. Bleeding: None.  Movement: Absent. Denies leaking of fluid.   The following portions of the patient's history were reviewed and updated as appropriate: allergies, current medications, past family history, past medical history, past social history, past surgical history and problem list.   Objective:    Vitals:   01/08/24 1124  BP: 112/76  Pulse: (!) 109  Weight: 209 lb (94.8 kg)    Fetal Status:  Fetal Heart Rate (bpm): 152   Movement: Absent    General: Alert, oriented and cooperative. Patient is in no acute distress.  Skin: Skin is warm and dry. No rash noted.   Cardiovascular: Normal heart rate noted  Respiratory: Normal respiratory effort, no problems with respiration noted  Abdomen: Soft, gravid, appropriate for gestational age.  Pain/Pressure: Absent     Pelvic: Cervical exam deferred        Extremities: Normal range of motion.  Edema: None  Mental Status: Normal mood and affect. Normal behavior. Normal judgment and thought content.   Assessment and Plan:  Pregnancy: G1P0 at [redacted]w[redacted]d 1. [redacted] weeks gestation of pregnancy 2. Supervision of low-risk first pregnancy, unspecified trimester (Primary) AFP to be done today, will follow up results and manage accordingly. Anatomy scan already scheduled ASA prescribed for preeclampsia prevention.  - AFP, Serum, Open Spina Bifida - aspirin  EC 81 MG tablet; Take 1 tablet (81 mg total) by mouth at bedtime. Start taking when you are [redacted] weeks pregnant for rest of pregnancy for prevention of preeclampsia  Dispense:  300 tablet; Refill: 2 No other complaints or concerns.  Routine obstetric precautions reviewed.  Please refer to After Visit Summary for other counseling recommendations.   Return in about 4 weeks (around 02/05/2024) for OFFICE OB VISIT (MD or APP).  Future Appointments  Date Time Provider Department Center  02/05/2024 10:00 AM [facility] PROVIDER 1 [facility]  02/05/2024 10:30 AM [facility] US1 [facility]    Gloris Hugger, MD

## 2024-01-10 LAB — AFP, SERUM, OPEN SPINA BIFIDA
AFP MoM: 0.73
AFP Value: 17.3 ng/mL
Gest. Age on Collection Date: 15 wk
Maternal Age At EDD: 22.3 a
OSBR Risk 1 IN: 10000
Test Results:: NEGATIVE
Weight: 209 [lb_av]

## 2024-01-12 ENCOUNTER — Ambulatory Visit: Payer: Self-pay | Admitting: Obstetrics & Gynecology

## 2024-01-12 DIAGNOSIS — Z3400 Encounter for supervision of normal first pregnancy, unspecified trimester: Secondary | ICD-10-CM

## 2024-02-05 ENCOUNTER — Ambulatory Visit (HOSPITAL_BASED_OUTPATIENT_CLINIC_OR_DEPARTMENT_OTHER)

## 2024-02-05 ENCOUNTER — Ambulatory Visit: Attending: Certified Nurse Midwife | Admitting: Maternal & Fetal Medicine

## 2024-02-05 ENCOUNTER — Other Ambulatory Visit: Payer: Self-pay | Admitting: *Deleted

## 2024-02-05 VITALS — BP 129/65 | HR 81

## 2024-02-05 DIAGNOSIS — Z363 Encounter for antenatal screening for malformations: Secondary | ICD-10-CM | POA: Insufficient documentation

## 2024-02-05 DIAGNOSIS — O99212 Obesity complicating pregnancy, second trimester: Secondary | ICD-10-CM | POA: Diagnosis not present

## 2024-02-05 DIAGNOSIS — Z3A19 19 weeks gestation of pregnancy: Secondary | ICD-10-CM | POA: Insufficient documentation

## 2024-02-05 DIAGNOSIS — E669 Obesity, unspecified: Secondary | ICD-10-CM | POA: Diagnosis not present

## 2024-02-05 DIAGNOSIS — Z3400 Encounter for supervision of normal first pregnancy, unspecified trimester: Secondary | ICD-10-CM | POA: Diagnosis present

## 2024-02-05 DIAGNOSIS — Z362 Encounter for other antenatal screening follow-up: Secondary | ICD-10-CM

## 2024-02-05 DIAGNOSIS — Z3689 Encounter for other specified antenatal screening: Secondary | ICD-10-CM

## 2024-02-05 NOTE — Progress Notes (Signed)
 Patient information  Patient Name: Catherine Lawrence  Patient MRN:   [id number]  Referring practice: MFM Referring Provider: [facility])  Problem List   Patient Active Problem List   Diagnosis Date Noted   Obesity affecting pregnancy, antepartum 12/04/2023   Supervision of low-risk first pregnancy, unspecified trimester 12/04/2023   Episodic headache 11/23/2023    Catherine Lawrence is a 23 y.o. G1P0 at [redacted]w[redacted]d here for ultrasound and consultation. She had Low risk aneuploidy screening of a female fetus. Carrier screening was Negative for the basic screening (SMA, alpha-thal, beta-thal, and cystic fibroisis. Catherine serum AFP was negative. She has no acute concerns.   Today we focused on the following:   Elevated BMI: I discussed the potential complications associated with obesity in pregnancy.  These complications include but are not limited to increased risk of excessive Catherine weight gain, fetal growth abnormalities, fetal congenital disorders, inability to visualize fetal anatomic structures on ultrasound, gestational diabetes, hypertensive disorders of pregnancy, operative birth including cesarean delivery or assisted vaginal delivery, delayed wound healing and many long-term health complications.  I discussed the need for continued growth ultrasounds and possibly antenatal testing depending upon how the pregnancy course progresses.  Catherine weight gain should be limited to 10 to 20 pounds during the pregnancy.  While normal weight loss may occur during the first and early second trimester, efforts to actively lose weight with the use of medication is not recommended during pregnancy.  A whole food diet and regular exercise of at least 15 to 30 minutes of moderately strenuous activity is recommended in the absence of any contraindications. Weight loss with the use of medications is not recommended during pregnancy.  If  other existing comorbidities are present then 81 mg of aspirin  should be considered for preeclampsia risk reduction.     Sonographic findings Single intrauterine pregnancy at 19w 4d  Fetal cardiac activity:  Observed and appears normal. Presentation: Transverse, head to Catherine right. The anatomic structures that were well seen appear normal without evidence of soft markers. Due to poor acoustic windows some structures remain suboptimally visualized. Fetal biometry shows the estimated fetal weight at the 64 percentile.  Amniotic fluid: Within normal limits.  MVP: 5.87 cm. Placenta: Posterior. Adnexa: No abnormality visualized. Cervical length: 3.8 cm.  There are limitations of prenatal ultrasound such as the inability to detect certain abnormalities due to poor visualization. Various factors such as fetal position, gestational age and Catherine body habitus may increase the difficulty in visualizing the fetal anatomy.    Recommendations - EDD should be 06/27/2024 based on  Early Ultrasound  (11/12/23). - Anatomy ultrasound was done today with the above findings (see report). - Follow up ultrasound in 4-6 weeks to attempt visualization of the anatomy not seen and reassess the fetal growth - Aspirin  81-162 mg to start now and to be continued throughout the pregnancy for preeclampsia prophylaxis. - Baseline labs: CMP, CBC, urine protein creatinine ratio. - Blood pressure goal of < 140 systolic and < 90 diastolic.   Review of Systems: A review of systems was performed and was negative except per HPI   Past Obstetrical History:  OB History  Gravida Para Term Preterm AB Living  1       SAB IAB Ectopic Multiple Live Births          # Outcome Date GA Lbr Len/2nd Weight Sex Type Anes PTL Lv  1 Current  Past Medical History:  Past Medical History:  Diagnosis Date   Anemia    Anxiety    Depression    hx of depression    Eczema    Episodic headache 11/23/2023   Headache     Low calcium levels      Past Surgical History:    Past Surgical History:  Procedure Laterality Date   NO PAST SURGERIES       Home Medications:   Current Outpatient Medications on File Prior to Visit  Medication Sig Dispense Refill   aspirin  EC 81 MG tablet Take 1 tablet (81 mg total) by mouth at bedtime. Start taking when you are [redacted] weeks pregnant for rest of pregnancy for prevention of preeclampsia 300 tablet 2   cetirizine (ZYRTEC) 10 MG tablet Take 1 tablet by mouth daily.     FEROSUL 325 (65 Fe) MG tablet Take by mouth.     ondansetron  (ZOFRAN -ODT) 4 MG disintegrating tablet Take 1 tablet (4 mg total) by mouth every 8 (eight) hours as needed for nausea or vomiting. 15 tablet 3   Prenatal Vit-Fe Fumarate-FA (PRENATAL MULTIVITAMIN) TABS tablet Take 1 tablet by mouth daily at 12 noon.     mometasone  (ELOCON ) 0.1 % ointment Apply topically as needed (for skin rash). (Patient not taking: Reported on 02/05/2024)     triamcinolone ointment (KENALOG) 0.1 % Apply topically as needed. (Patient not taking: Reported on 02/05/2024)     No current facility-administered medications on file prior to visit.      Allergies:   Allergies  Allergen Reactions   Aleve Arthritis Pain [Diclofenac Sodium] Rash     Physical Exam:   Vitals:   02/05/24 1009  BP: 129/65  Pulse: 81   Sitting comfortably on the sonogram table Nonlabored breathing Normal rate and rhythm Abdomen is nontender  Thank you for the opportunity to be involved with this patient's care. Please let us  know if we can be of any further assistance.   30 minutes of time was spent reviewing the patient's chart including labs, imaging and documentation.  At least 50% of this time was spent with direct patient care discussing the diagnosis, management and prognosis of her care.  [name] MFM, [facility]   02/05/2024  10:55 AM

## 2024-02-06 ENCOUNTER — Other Ambulatory Visit: Payer: Self-pay

## 2024-02-06 ENCOUNTER — Encounter: Payer: Self-pay | Admitting: Obstetrics & Gynecology

## 2024-02-06 ENCOUNTER — Ambulatory Visit (INDEPENDENT_AMBULATORY_CARE_PROVIDER_SITE_OTHER): Admitting: Obstetrics & Gynecology

## 2024-02-06 VITALS — BP 130/75 | HR 89 | Wt 212.0 lb

## 2024-02-06 DIAGNOSIS — L309 Dermatitis, unspecified: Secondary | ICD-10-CM | POA: Diagnosis not present

## 2024-02-06 DIAGNOSIS — Z3402 Encounter for supervision of normal first pregnancy, second trimester: Secondary | ICD-10-CM | POA: Diagnosis not present

## 2024-02-06 DIAGNOSIS — Z23 Encounter for immunization: Secondary | ICD-10-CM

## 2024-02-06 DIAGNOSIS — Z3A19 19 weeks gestation of pregnancy: Secondary | ICD-10-CM

## 2024-02-06 MED ORDER — MOMETASONE FUROATE 0.1 % EX OINT: TOPICAL_OINTMENT | CUTANEOUS | 3 refills | Status: DC | PRN

## 2024-02-06 NOTE — Progress Notes (Signed)
 PRENATAL VISIT NOTE  Subjective:  Catherine Lawrence is a 23 y.o. G1P0 at [redacted]w[redacted]d being seen today for ongoing prenatal care.  She is currently monitored for the following issues for this low-risk pregnancy and has Episodic headache; Obesity affecting pregnancy, antepartum, second trimester; and Supervision of low-risk first pregnancy, unspecified trimester on their problem list.  Patient reports that she wants medication for her eczema.  Contractions: Not present. Vag. Bleeding: None.  Movement: Present. Denies leaking of fluid.   The following portions of the patient's history were reviewed and updated as appropriate: allergies, current medications, past family history, past medical history, past social history, past surgical history and problem list.   Objective:    Vitals:   02/06/24 0823  BP: 130/75  Pulse: 89  Weight: 212 lb (96.2 kg)    Fetal Status:  Fetal Heart Rate (bpm): 147   Movement: Present    General: Alert, oriented and cooperative. Patient is in no acute distress.  Skin: Skin is warm and dry. No rash noted.  Eczema lesions noted in hands.  Cardiovascular: Normal heart rate noted  Respiratory: Normal respiratory effort, no problems with respiration noted  Abdomen: Soft, gravid, appropriate for gestational age.  Pain/Pressure: Absent     Pelvic: Cervical exam deferred        Extremities: Normal range of motion.  Edema: None  Mental Status: Normal mood and affect. Normal behavior. Normal judgment and thought content.   US  MFM OB DETAIL +14 WK Result Date: 02/05/2024 ----------------------------------------------------------------------  OBSTETRICS REPORT                       (Signed Final 02/05/2024 11:09 am) ---------------------------------------------------------------------- Patient Info  ID #:       [id number]                          D.O.B.:  May 22, 2001 (23 yrs)(F)  Name:       Catherine Lawrence           Visit Date: 02/05/2024 09:23 am  ---------------------------------------------------------------------- Performed By  Attending:        [name] DO       Ref. Address:     [facility]  Performed By:     Rumaldo Sharps RDMS      [facility]  Referred By:      DELILAH CHRISTELLA CEDAR CNM ---------------------------------------------------------------------- Orders  #  Description                           Code        Ordered By  1  US  MFM OB DETAIL +14 WK               23188.98    DELILAH CEDAR ----------------------------------------------------------------------  #  Order #                     Accession #                Episode #  1  [id number]                   [id number]                 [id number] ---------------------------------------------------------------------- Indications  Obesity complicating pregnancy, second         O99.212  trimester (BMI 35.6)  Low risk NIPS female - Neg AFP - NEG  [facility]  Encounter for antenatal screening for          Z36.3  malformations  [redacted] weeks gestation of pregnancy                Z3A.19 ---------------------------------------------------------------------- Vital Signs  BP:          129/65 ---------------------------------------------------------------------- Fetal Evaluation  Num Of Fetuses:         1  Fetal Heart Rate(bpm):  153  Cardiac Activity:       Observed  Presentation:           Transverse, head to maternal right  Placenta:               Posterior  P. Cord Insertion:      Visualized, central  Amniotic Fluid  AFI FV:      Within normal limits                              Largest Pocket(cm)                              5.87 ---------------------------------------------------------------------- Biometry   BPD:      48.6  mm     G. Age:  20w 5d         89  %    CI:        77.07   %    70 - 86                                                          FL/HC:      17.3   %  16.8 - 19.8  HC:      175.3  mm     G. Age:  20w 0d         65  %    HC/AC:      1.17        1.09 - 1.39  AC:      149.9  mm     G. Age:  20w 2d         67  %    FL/BPD:     62.3   %  FL:       30.3  mm     G. Age:  19w 3d         35  %    FL/AC:      20.2   %    20 - 24  HUM:        31  mm     G. Age:  20w 2d         71  %  CER:      20.9  mm     G. Age:  20w 0d         74  %  NFT:       4.4  mm  LV:        5.2  mm  CM:        5.4  mm  Est. FW:     319  gm    0 lb 11 oz      64  % ---------------------------------------------------------------------- OB History  Gravidity:    1 ---------------------------------------------------------------------- Gestational Age  LMP:           20w 5d        Date:  09/13/23                  EDD:   06/19/24  U/S Today:     20w 1d                                        EDD:   06/23/24  Best:          19w 4d     Det. By:  Early Ultrasound         EDD:   06/27/24                                      (11/12/23) ---------------------------------------------------------------------- Targeted Anatomy  Central Nervous System  Calvarium/Cranial V.:  Appears normal         Cereb./Vermis:          Appears normal  Cavum:                 Appears normal         Cisterna Magna:         Appears normal  Lateral Ventricles:    Appears normal         Midline Falx:           Appears normal  Choroid Plexus:        Appears normal  Spine  Cervical:              Appears normal         Sacral:  Appears normal  Thoracic:              Appears normal         Shape/Curvature:        Appears normal  Lumbar:                Appears normal  Head/Neck  Lips:                  Appears normal         Profile:                Appears normal  Neck:                  Appears normal         Orbits/Eyes:            Appears normal  Nuchal Fold:            Appears normal         Mandible:               Appears normal  Nasal Bone:            Present                Maxilla:                Appears normal  Thorax  4 Chamber View:        Appears normal         Interventr. Septum:     Appears normal  Cardiac Rhythm:        Normal                 Cardiac Axis:           Normal  Cardiac Situs:         Appears normal         Diaphragm:              Appears normal  Rt Outflow Tract:      Appears normal         3 Vessel View:          Appears normal  Lt Outflow Tract:      Not well visualized    3 V Trachea View:       Not well visualized  Aortic Arch:           Not well visualized    IVC:                    Appears normal  Ductal Arch:           Not well visualized    Crossing:               Not well visualized  SVC:                   Appears normal  Abdomen  Ventral Wall:          Appears normal         Lt Kidney:              Appears normal  Cord Insertion:        Appears normal         Rt Kidney:              Appears normal  Situs:  Appears normal         Bladder:                Appears normal  Stomach:               Appears normal  Extremities  Lt Humerus:            Appears normal         Lt Femur:               Appears normal  Rt Humerus:            Appears normal         Rt Femur:               Appears normal  Lt Forearm:            Appears normal         Lt Lower Leg:           Appears normal  Rt Forearm:            Appears normal         Rt Lower Leg:           Appears normal  Lt Hand:               Open hand nml          Lt Foot:                Nml heel/foot  Rt Hand:               Open hand nml          Rt Foot:                Nml heel/foot  Other  Umbilical Cord:        Normal 3-vessel        Genitalia:              Female-nml  Comment:     Technically difficult due to fetal position. ---------------------------------------------------------------------- Cervix Uterus Adnexa  Cervix  Length:            3.8  cm.  Normal appearance by transabdominal  scan  Uterus  No abnormality visualized.  Right Ovary  Not visualized.  Left Ovary  Not visualized.  Cul De Sac  No free fluid seen.  Adnexa  No abnormality visualized ---------------------------------------------------------------------- Comments  Maternal Fetal Medicine Consult  Callahan HUERTA-GARCIA is a 22 y.o. G1P0 at [redacted]w[redacted]d here  for ultrasound and consultation. She had Low risk aneuploidy  screening of a female fetus. Carrier screening was Negative for  the basic screening (SMA, alpha-thal, beta-thal, and cystic  fibroisis. Maternal serum AFP was negative. She has no  acute concerns.  Today we focused on the following:  Elevated BMI: I discussed the potential complications  associated with obesity in pregnancy.  These complications  include but are not limited to increased risk of excessive  maternal weight gain, fetal growth abnormalities, fetal  congenital disorders, inability to visualize fetal anatomic  structures on ultrasound, gestational diabetes, hypertensive  disorders of pregnancy, operative birth including cesarean  delivery or assisted vaginal delivery, delayed wound healing  and many long-term health complications.  I discussed the  need for continued growth ultrasounds and possibly antenatal  testing depending upon how the pregnancy course  progresses.  Maternal weight gain should be limited to 10 to  20 pounds during the pregnancy.  While normal  weight loss  may occur during the first and early second trimester, efforts  to actively lose weight with the use of medication is not  recommended during pregnancy.  A whole food diet and  regular exercise of at least 15 to 30 minutes of moderately  strenuous activity is recommended in the absence of any  contraindications. Weight loss with the use of medications is  not recommended during pregnancy.  If other existing  comorbidities are present then 81 mg of aspirin  should be  considered for preeclampsia risk reduction.  Sonographic findings  Single  intrauterine pregnancy at 19w 4d  Fetal cardiac activity:  Observed and appears normal.  Presentation: Transverse, head to maternal right.  The anatomic structures that were well seen appear normal  without evidence of soft markers. Due to poor acoustic  windows some structures remain suboptimally visualized.  Fetal biometry shows the estimated fetal weight at the 64  percentile.  Amniotic fluid: Within normal limits.  MVP: 5.87 cm.  Placenta: Posterior.  Adnexa: No abnormality visualized.  Cervical length: 3.8 cm.  There are limitations of prenatal ultrasound such as the  inability to detect certain abnormalities due to poor  visualization. Various factors such as fetal position,  gestational age and maternal body habitus may increase the  difficulty in visualizing the fetal anatomy.  Recommendations  - EDD should be 06/27/2024 based on  Early Ultrasound  (11/12/23).  - Anatomy ultrasound was done today with the above findings  (see report).  - Follow up ultrasound in 4-6 weeks to attempt visualization of  the anatomy not seen and reassess the fetal growth  - Aspirin  81-162 mg to start now and to be continued  throughout the pregnancy for preeclampsia prophylaxis.  - Baseline labs: CMP, CBC, urine protein creatinine ratio.  - Blood pressure goal of < 140 systolic and < 90 diastolic. ----------------------------------------------------------------------                 [name], DO Electronically Signed Final Report   02/05/2024 11:09 am ----------------------------------------------------------------------    Assessment and Plan:  Pregnancy: G1P0 at [redacted]w[redacted]d 1. Need for immunization against influenza - Flu vaccine trivalent PF, 6mos and older(Flulaval,Afluria,Fluarix,Fluzone) given  2. Eczema, unspecified type Elocon  prescribed. - mometasone  (ELOCON ) 0.1 % ointment; Apply topically as needed (eczema).  Dispense: 45 g; Refill: 3  3. [redacted] weeks gestation of pregnancy 4. Supervision of low-risk first  pregnancy, second trimester (Primary) Preterm labor symptoms and general obstetric precautions including but not limited to vaginal bleeding, contractions, leaking of fluid and fetal movement were reviewed in detail with the patient. Please refer to After Visit Summary for other counseling recommendations.   Return in about 4 weeks (around 03/05/2024) for OFFICE OB VISIT (MD or APP).  Future Appointments  Date Time Provider Department Center  03/05/2024  8:55 AM [name], MD [facility]  03/11/2024 10:15 AM [facility] PROVIDER 1 [facility]  03/11/2024 10:30 AM [facility] US2 [facility]  04/01/2024  8:55 AM Eveline Lynwood MATSU, MD [facility]  04/01/2024  9:40 AM [facility] LAB [facility]  04/23/2024  9:15 AM [facility]-GENERAL 2 [facility]  05/07/2024  9:35 AM [facility]GENERAL 2 [facility]  05/21/2024  9:35 AM [facility]-GENERAL 2 [facility]  06/04/2024  9:35 AM [facility]-GENERAL 2 [facility]  06/11/2024  9:35 AM [facility]-GENERAL 2 [facility]  06/18/2024  9:35 AM [facility]-GENERAL 2 [facility]  06/25/2024  9:35 AM [facility]GENERAL 2 [facility]  07/02/2024  9:35 AM [facility]-GENERAL 2 [facility]  Gloris Hugger, MD

## 2024-02-09 MED ORDER — MOMETASONE FUROATE 0.1 % EX OINT: TOPICAL_OINTMENT | Freq: Every day | CUTANEOUS | 3 refills | Status: DC | PRN

## 2024-02-09 NOTE — Addendum Note (Signed)
 Addended by: HERCHEL GRUMET A on: 02/09/2024 07:43 PM   Modules accepted: Orders

## 2024-02-26 ENCOUNTER — Encounter (HOSPITAL_COMMUNITY): Payer: Self-pay | Admitting: Obstetrics and Gynecology

## 2024-02-26 ENCOUNTER — Inpatient Hospital Stay (HOSPITAL_COMMUNITY)

## 2024-02-26 ENCOUNTER — Inpatient Hospital Stay (HOSPITAL_COMMUNITY)
Admission: AD | Admit: 2024-02-26 | Discharge: 2024-02-26 | Disposition: A | Discharge status: Home / Self Care | Attending: Obstetrics and Gynecology | Admitting: Obstetrics and Gynecology

## 2024-02-26 DIAGNOSIS — O98812 Other maternal infectious and parasitic diseases complicating pregnancy, second trimester: Secondary | ICD-10-CM | POA: Diagnosis not present

## 2024-02-26 DIAGNOSIS — N898 Other specified noninflammatory disorders of vagina: Secondary | ICD-10-CM | POA: Diagnosis present

## 2024-02-26 DIAGNOSIS — B3731 Acute candidiasis of vulva and vagina: Secondary | ICD-10-CM | POA: Diagnosis not present

## 2024-02-26 DIAGNOSIS — Z3A22 22 weeks gestation of pregnancy: Secondary | ICD-10-CM | POA: Diagnosis not present

## 2024-02-26 DIAGNOSIS — O4692 Antepartum hemorrhage, unspecified, second trimester: Secondary | ICD-10-CM | POA: Diagnosis not present

## 2024-02-26 DIAGNOSIS — Z3492 Encounter for supervision of normal pregnancy, unspecified, second trimester: Secondary | ICD-10-CM

## 2024-02-26 HISTORY — DX: Palpitations: R00.2

## 2024-02-26 LAB — POCT FERN TEST
POCT Fern Test: NEGATIVE
POCT Fern Test: NEGATIVE

## 2024-02-26 LAB — URINALYSIS, ROUTINE W REFLEX MICROSCOPIC
Bilirubin Urine: NEGATIVE
Glucose, UA: NEGATIVE mg/dL
Ketones, ur: NEGATIVE mg/dL
Leukocytes,Ua: NEGATIVE
Nitrite: NEGATIVE
Protein, ur: NEGATIVE mg/dL
Specific Gravity, Urine: 1.01 (ref 1.005–1.030)
pH: 6 (ref 5.0–8.0)

## 2024-02-26 LAB — WET PREP, GENITAL
Clue Cells Wet Prep HPF POC: NONE SEEN
Sperm: NONE SEEN
Trich, Wet Prep: NONE SEEN
WBC, Wet Prep HPF POC: >=10 — AB (ref <10)

## 2024-02-26 LAB — HIV ANTIBODY (ROUTINE TESTING W REFLEX): HIV Screen 4th Generation wRfx: NONREACTIVE

## 2024-02-26 LAB — RUPTURE OF MEMBRANE (ROM)PLUS: Rom Plus: NEGATIVE

## 2024-02-26 MED ORDER — TERCONAZOLE 0.4 % VA CREA: 1.0000 | TOPICAL_CREAM | Freq: Every day | VAGINAL | 0 refills | Status: AC

## 2024-02-26 NOTE — MAU Provider Note (Addendum)
 History  Catherine Lawrence is a 23 y.o. female G1P0 at [redacted]w[redacted]d who presents with vaginal bleeding.   No chief complaint on file.  HPI Patient reports that 1100 02/26/24, she went to the bathroom at work. When she wiped, she noticed pink fluid staining the paper that progressed to darker red blood. She wiped 3-4 times and the red staining persisted, prompting her to seek treatment at the MAU.  In the MAU, she went to the bathroom again and noticed redness on the paper, which stopped after subsequent wipes.  On ROS, patient endorses leakage of milky white vaginal discharge with no odor and associated itchiness down there. She reports decreased FM over the past 2 days, 5/10 backache, and 5/10 abdominal pain, which she describes as constant discomfort and tightness. She endorses 1 month SOB and intermittent palpitations. She endorses frothy urine and increased urinary frequency and denies change in color and smell. She denies fever, chills, N/V, HA, RUQ pain, and new-onset edema.  Of note, patient reports hx irregular, heavy periods (6 pads and tampons daily, severe pain, hx controlled with OCPs) and iron deficiency anemia on FEROSUL 352 almost daily. She denies personal or family history of bleeding or clotting disorders. She reports her last time having sexual intercourse was 3 weeks ago. 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  Past Medical History:  Diagnosis Date   Anemia    Anxiety    Depression    hx of depression    Eczema    Headache    Low calcium levels    Palpitations    has not had work up    Past Surgical History:  Procedure Laterality Date   NO PAST SURGERIES      Family History  Problem Relation Age of Onset   Diabetes Father    Diabetes Mother    Hypertension Mother    Hyperthyroidism Mother    Migraines Neg Hx     Social History   Tobacco Use   Smoking status: Never   Smokeless tobacco: Never   Tobacco  comments:    Quit vaping with +preg  Vaping Use   Vaping status: Former   Substances: Nicotine  Substance Use Topics   Alcohol use: Not Currently    Comment: socially   Drug use: Never    Allergies:  Allergies  Allergen Reactions   Aleve Arthritis Pain [Diclofenac Sodium] Rash    Medications Prior to Admission  Medication Sig Dispense Refill Last Dose/Taking   aspirin  EC 81 MG tablet Take 1 tablet (81 mg total) by mouth at bedtime. Start taking when you are [redacted] weeks pregnant for rest of pregnancy for prevention of preeclampsia 300 tablet 2 02/25/2024 Evening   FEROSUL 325 (65 Fe) MG tablet Take by mouth.   Past Week   Prenatal Vit-Fe Fumarate-FA (PRENATAL MULTIVITAMIN) TABS tablet Take 1 tablet by mouth daily at 12 noon.   02/25/2024   cetirizine (ZYRTEC) 10 MG tablet Take 1 tablet by mouth daily.      mometasone  (ELOCON ) 0.1 % ointment Apply topically daily as needed (eczema). Apply thin film topically to affected area once daily as needed for eczema flareups.  Total days of use should not exceed 14 days at a time. 45 g 3 More than a month   ondansetron  (ZOFRAN -ODT) 4 MG disintegrating tablet Take 1 tablet (4 mg total) by mouth every 8 (eight) hours as needed for nausea or vomiting. 15 tablet 3  More than a month   triamcinolone ointment (KENALOG) 0.1 % Apply topically as needed. (Patient not taking: Reported on 02/06/2024)       Review of Systems negative aside from what is listed in HPI.  Physical Exam Blood pressure 136/72, pulse 92, temperature 98.9 F (37.2 C), temperature source Oral, resp. rate 17, height 5' 3 (1.6 m), weight 99.7 kg, last menstrual period 09/13/2023, SpO2 98%. Physical Exam Exam conducted with a chaperone present.  Constitutional:      General: She is not in acute distress.    Appearance: She is not toxic-appearing.  Cardiovascular:     Rate and Rhythm: Normal rate.  Pulmonary:     Effort: Pulmonary effort is normal. No respiratory distress.   Genitourinary:    General: Normal vulva.     Exam position: Lithotomy position.     Comments: No tenderness appreciated on insertion of the speculum. No bleeding appreciated. Moderate amount of thin white vaginal discharge. Cervix visually closed; however, cervix appeared erythematous consistent with ectropion tissue. Neurological:     Mental Status: She is alert. Mental status is at baseline.  Psychiatric:        Mood and Affect: Mood normal.        Behavior: Behavior normal.    FHT (<23w) 150  Labs - Wet prep: Yeast present, WBC >=10 - POCT fern negative - ROM plus negative - UA: Hgb moderate, bacteria rare - GC pending - HIV pending  Imaging US  MFM OB DETAIL +14 WK (02/05/24): single IUP at [redacted]w[redacted]d, EFW 64%ile, amniotic fluid WNL, placenta posterior, cervix 3.8 cm US  MFM OB LIMITED: Good fetal movement and amniotic fluid volume. There is no evidence of placenta previa or abruption, placental lakes were identified.  Assessment and Plan Catherine Lawrence is a 23 y.o. female G1P0 at [redacted]w[redacted]d who presents with vaginal bleeding and leakage of fluid.  # Vaginal bleeding # Vaginal candidiasis The ddx for second-trimester bleeding includes, but is not limited to, vaginal candidiasis, PID, cervical trauma, placental abruption, placenta previa, vasa previa, and bleeding disorder. Vaginal candidiasis is at the top of my differential given vulvovaginal itchiness, milky discharge, and wet prep +yeast c/w dx. This could explain minimal bleeding 2/2 irritation. PID possible given vaginal bleeding, itching, and pelvic pain/discomfort. Cervical trauma less likely given last sexual intercourse 3 weeks ago. Other etiologies less likely given overall clinical picture and today's US  results. - TERCONAZOLE  0.4 % vaginal applicator at bedtime x 7 days  # No hx Pap Patient is 22 and denied prior pap. - Recommend pap at next [facility] appointment with Dr. Ilean 03/05/24  Patient is discharged home in  stable condition. All questions answered and anticipatory guidance and return precautions provided.  Jayson Ness, MS3 02/26/24 5:46 PM   Attestation of Supervision of Student:  I confirm that I have verified the information documented in the medical student's note and that I have also personally supervised the history, physical exam and all medical decision making activities.  I have verified that all services and findings are accurately documented in this student's note; and I agree with management and plan as outlined in the documentation. I have also made any necessary editorial changes.  Camie DELENA Rote, CNM [facility] [employer], [facility] 02/26/2024 5:55 PM

## 2024-02-26 NOTE — MAU Note (Addendum)
 Catherine Lawrence is a 23 y.o. at [redacted]w[redacted]d here in MAU reporting: was at work.  Went to the bathroom and whenever she would wipe, there was blood. Light pinkish red. No clots. No recent intercourse or exams.  Some pain in lower back.  No placental issues on US , no prior bleeding.   Onset of complaint: ~1100 Pain score: mild, comes and goes, this morning Vitals:   02/26/24 1151  BP: 136/72  Pulse: 92  Resp: 17  Temp: 98.9 F (37.2 C)  SpO2: 98%     FHT:150 Lab orders placed from triage:  urine

## 2024-02-27 LAB — GC/CHLAMYDIA PROBE AMP (~~LOC~~) NOT AT ARMC
Chlamydia: NEGATIVE
Comment: NEGATIVE
Comment: NORMAL
Neisseria Gonorrhea: NEGATIVE

## 2024-03-05 ENCOUNTER — Other Ambulatory Visit: Payer: Self-pay

## 2024-03-05 ENCOUNTER — Encounter: Payer: Self-pay | Admitting: Family Medicine

## 2024-03-05 ENCOUNTER — Ambulatory Visit: Admitting: Family Medicine

## 2024-03-05 VITALS — BP 117/80 | HR 98 | Wt 218.7 lb

## 2024-03-05 DIAGNOSIS — Z3A23 23 weeks gestation of pregnancy: Secondary | ICD-10-CM | POA: Diagnosis not present

## 2024-03-05 DIAGNOSIS — O4692 Antepartum hemorrhage, unspecified, second trimester: Secondary | ICD-10-CM

## 2024-03-05 DIAGNOSIS — B3731 Acute candidiasis of vulva and vagina: Secondary | ICD-10-CM

## 2024-03-05 DIAGNOSIS — Z3402 Encounter for supervision of normal first pregnancy, second trimester: Secondary | ICD-10-CM

## 2024-03-05 NOTE — Patient Instructions (Signed)

## 2024-03-05 NOTE — Progress Notes (Signed)
   PRENATAL VISIT NOTE  Subjective:  Catherine Lawrence is a 23 y.o. G1P0 at [redacted]w[redacted]d being seen today for ongoing prenatal care.  She is currently monitored for the following issues for this low-risk pregnancy and has Episodic headache; Obesity affecting pregnancy, antepartum, second trimester; and Supervision of low-risk first pregnancy, unspecified trimester on their problem list.  Patient reports no bleeding, no contractions, no cramping, and no leaking.  Contractions: Not present. Vag. Bleeding: None.  Movement: Present. Denies leaking of fluid.   The following portions of the patient's history were reviewed and updated as appropriate: allergies, current medications, past family history, past medical history, past social history, past surgical history and problem list.   Objective:    Vitals:   03/05/24 0910  BP: 117/80  Pulse: 98  Weight: 218 lb 11.2 oz (99.2 kg)    Fetal Status:  Fetal Heart Rate (bpm): 149   Movement: Present    General: Alert, oriented and cooperative. Patient is in no acute distress.  Skin: Skin is warm and dry. No rash noted.   Cardiovascular: Normal heart rate noted  Respiratory: Normal respiratory effort, no problems with respiration noted  Abdomen: Soft, gravid, appropriate for gestational age.  Pain/Pressure: Absent     Pelvic: Cervical exam deferred        Extremities: Normal range of motion.  Edema: None  Mental Status: Normal mood and affect. Normal behavior. Normal judgment and thought content.   Assessment and Plan:  Pregnancy: G1P0 at [redacted]w[redacted]d 1. Supervision of low-risk first pregnancy, second trimester (Primary) FHR BP appropriate today  2. Vaginal candidiasis Picked up treatment today  3. Vaginal bleeding in pregnancy, second trimester This was at the MAU.  No bleeding since then.  Diagnosed with vaginal candidiasis  4. [redacted] weeks gestation of pregnancy Follow-up in 4 weeks.  Discussed GTT at length and counseled on no eating for 8 hours  before the test.  Preterm labor symptoms and general obstetric precautions including but not limited to vaginal bleeding, contractions, leaking of fluid and fetal movement were reviewed in detail with the patient. Please refer to After Visit Summary for other counseling recommendations.   No follow-ups on file.  Future Appointments  Date Time Provider Department Center  03/11/2024 10:15 AM [facility] PROVIDER 1 [facility]  03/11/2024 10:30 AM [facility] US2 [facility]  04/01/2024  8:35 AM [name], MD [facility]  04/01/2024  9:40 AM [facility]-WOCA LAB [facility]  04/23/2024  8:15 AM [name], MD [facility]  05/07/2024  8:15 AM [facility]-C[facility] MOM BABY DYAD PROVIDER [facility]  05/21/2024  8:55 AM [facility] MOM BABY DYAD PROVIDER [facility]  06/04/2024  8:15 AM W[facility]-C[facility] MOM BABY DYAD PROVIDER [facility]  06/11/2024  8:55 AM W[facility]-C[facility] MOM BABY DYAD PROVIDER [facility]MBD [facility]  06/18/2024  8:55 AM W[facility]-CWH MOM BABY DYAD PROVIDER [facility]-MBD [facility]  06/25/2024  8:55 AM WMC-CWH MOM BABY DYAD PROVIDER [facility]  07/02/2024  8:55 AM W[facility]-CWH MOM BABY DYAD PROVIDER WMC-MBD [facility]    Norleen LULLA Rover, MD

## 2024-03-11 ENCOUNTER — Ambulatory Visit: Attending: Maternal & Fetal Medicine | Admitting: Maternal & Fetal Medicine

## 2024-03-11 ENCOUNTER — Ambulatory Visit (HOSPITAL_BASED_OUTPATIENT_CLINIC_OR_DEPARTMENT_OTHER)

## 2024-03-11 ENCOUNTER — Other Ambulatory Visit: Payer: Self-pay

## 2024-03-11 DIAGNOSIS — O99212 Obesity complicating pregnancy, second trimester: Secondary | ICD-10-CM

## 2024-03-11 DIAGNOSIS — Z3A24 24 weeks gestation of pregnancy: Secondary | ICD-10-CM | POA: Diagnosis not present

## 2024-03-11 DIAGNOSIS — O4692 Antepartum hemorrhage, unspecified, second trimester: Secondary | ICD-10-CM

## 2024-03-11 DIAGNOSIS — Z362 Encounter for other antenatal screening follow-up: Secondary | ICD-10-CM | POA: Insufficient documentation

## 2024-03-11 DIAGNOSIS — E669 Obesity, unspecified: Secondary | ICD-10-CM

## 2024-03-11 NOTE — Progress Notes (Signed)
   Patient information  Patient Name: Catherine Lawrence  Patient MRN:   [id number]  Referring practice: MFM Referring Provider: [facility])  Problem List   Patient Active Problem List   Diagnosis Date Noted   Obesity affecting pregnancy, antepartum, second trimester 12/04/2023   Supervision of low-risk first pregnancy, unspecified trimester 12/04/2023   Episodic headache 11/23/2023    Maternal Fetal medicine Consult  Catherine Lawrence is a 23 y.o. G1P0 at [redacted]w[redacted]d here for ultrasound and consultation. Catherine Lawrence is doing well today with no acute concerns. Today we focused on the following:   The patient is here for a follow-up anatomy due to elevated BMI .  She has experienced vaginal bleeding during the pregnancy but this has resolved for some weeks now.  I reported that the fetal growth is normal.  We have also visualized the anatomy which appears normal as well.     The patient had time to ask questions that were answered to her satisfaction.  She verbalized understanding and agrees to proceed with the plan below.  Sonographic findings Single intrauterine pregnancy at 24w 4d.  Fetal cardiac activity:  Observed and appears normal. Presentation: Breech. Interval fetal anatomy appears normal. Fetal biometry shows the estimated fetal weight at the 80 percentile. Amniotic fluid volume: Within normal limits. MVP: 3.94 cm. Placenta: Posterior.  There are limitations of prenatal ultrasound such as the inability to detect certain abnormalities due to poor visualization. Various factors such as fetal position, gestational age and maternal body habitus may increase the difficulty in visualizing the fetal anatomy.    Recommendations -F/u US  at 32 weeks due to elevated BMI.   Review of Systems: A review of systems was performed and was negative except per HPI   Vitals and Physical Exam    03/11/2024   10:17 AM 03/05/2024    9:10 AM 02/26/2024     5:19 PM  Vitals with BMI  Weight  218 lbs 11 oz   BMI  38.75   Systolic 138 117 862  Diastolic 68 80 80  Pulse 96 98 98    Sitting comfortably on the sonogram table Nonlabored breathing Normal rate and rhythm Abdomen is nontender  Past pregnancies OB History  Gravida Para Term Preterm AB Living  1       SAB IAB Ectopic Multiple Live Births          # Outcome Date GA Lbr Len/2nd Weight Sex Type Anes PTL Lv  1 Current              I spent 10 minutes reviewing the patients chart, including labs and images as well as counseling the patient about her medical conditions. Greater than 50% of the time was spent in direct face-to-face patient counseling.  Catherine Lawrence  MFM, [place]   03/11/2024  4:41 PM

## 2024-04-01 ENCOUNTER — Ambulatory Visit: Admitting: Family Medicine

## 2024-04-01 ENCOUNTER — Encounter: Payer: Self-pay | Admitting: Family Medicine

## 2024-04-01 ENCOUNTER — Encounter: Admitting: Obstetrics & Gynecology

## 2024-04-01 ENCOUNTER — Other Ambulatory Visit: Payer: Self-pay

## 2024-04-01 ENCOUNTER — Other Ambulatory Visit

## 2024-04-01 VITALS — BP 117/81 | HR 97 | Wt 223.1 lb

## 2024-04-01 DIAGNOSIS — Z3400 Encounter for supervision of normal first pregnancy, unspecified trimester: Secondary | ICD-10-CM

## 2024-04-01 DIAGNOSIS — O99212 Obesity complicating pregnancy, second trimester: Secondary | ICD-10-CM

## 2024-04-01 DIAGNOSIS — Z23 Encounter for immunization: Secondary | ICD-10-CM | POA: Diagnosis not present

## 2024-04-01 DIAGNOSIS — Z3A37 37 weeks gestation of pregnancy: Secondary | ICD-10-CM

## 2024-04-01 DIAGNOSIS — Z3A27 27 weeks gestation of pregnancy: Secondary | ICD-10-CM

## 2024-04-01 DIAGNOSIS — O99213 Obesity complicating pregnancy, third trimester: Secondary | ICD-10-CM

## 2024-04-01 MED ORDER — ASPIRIN 81 MG PO TBEC: 81.0000 mg | DELAYED_RELEASE_TABLET | Freq: Every day | ORAL | 3 refills | Status: DC

## 2024-04-01 NOTE — Progress Notes (Signed)
   Subjective:  Catherine Lawrence is a 23 y.o. G1P0 at [redacted]w[redacted]d being seen today for ongoing prenatal care.  She is currently monitored for the following issues for this low-risk pregnancy and has Episodic headache; Obesity affecting pregnancy, antepartum, second trimester; and Supervision of low-risk first pregnancy, unspecified trimester on their problem list.  Patient reports no complaints.  Contractions: Not present. Vag. Bleeding: None.  Movement: Present. Denies leaking of fluid.   The following portions of the patient's history were reviewed and updated as appropriate: allergies, current medications, past family history, past medical history, past social history, past surgical history and problem list. Problem list updated.  Objective:   Vitals:   04/01/24 0842  BP: 117/81  Pulse: 97  Weight: 223 lb 1.6 oz (101.2 kg)    Fetal Status: Fetal Heart Rate (bpm): 142   Movement: Present     General:  Alert, oriented and cooperative. Patient is in no acute distress.  Skin: Skin is warm and dry. No rash noted.   Cardiovascular: Normal heart rate noted  Respiratory: Normal respiratory effort, no problems with respiration noted  Abdomen: Soft, gravid, appropriate for gestational age. Pain/Pressure: Present     Pelvic: Vag. Bleeding: None     Cervical exam deferred        Extremities: Normal range of motion.  Edema: None  Mental Status: Normal mood and affect. Normal behavior. Normal judgment and thought content.   Urinalysis:      Assessment and Plan:  Pregnancy: G1P0 at [redacted]w[redacted]d  1. Supervision of low-risk first pregnancy, unspecified trimester (Primary) BP and FHR normal Third trimester labs today Given TDAP today Refill sent for ASA per request Discussed contraception, planning on condoms  2. Obesity affecting pregnancy, antepartum, second trimester F/u US  at 32 weeks per MFM  Preterm labor symptoms and general obstetric precautions including but not limited to vaginal  bleeding, contractions, leaking of fluid and fetal movement were reviewed in detail with the patient. Please refer to After Visit Summary for other counseling recommendations.  Return in about 2 weeks (around 04/15/2024) for Dyad patient, ob visit.   [name], MD

## 2024-04-01 NOTE — Patient Instructions (Signed)

## 2024-04-02 ENCOUNTER — Ambulatory Visit: Payer: Self-pay | Admitting: Family Medicine

## 2024-04-02 DIAGNOSIS — Z3400 Encounter for supervision of normal first pregnancy, unspecified trimester: Secondary | ICD-10-CM

## 2024-04-02 LAB — CBC
Hematocrit: 37.1 % (ref 34.0–46.6)
Hemoglobin: 12.1 g/dL (ref 11.1–15.9)
MCH: 30.2 pg (ref 26.6–33.0)
MCHC: 32.6 g/dL (ref 31.5–35.7)
MCV: 93 fL (ref 79–97)
Platelets: 251 x10E3/uL (ref 150–450)
RBC: 4.01 x10E6/uL (ref 3.77–5.28)
RDW: 13.2 % (ref 11.7–15.4)
WBC: 10.7 x10E3/uL (ref 3.4–10.8)

## 2024-04-02 LAB — GLUCOSE TOLERANCE, 2 HOURS W/ 1HR
Glucose, 1 hour: 168 mg/dL (ref 70–179)
Glucose, 2 hour: 97 mg/dL (ref 70–152)
Glucose, Fasting: 83 mg/dL (ref 70–91)

## 2024-04-02 LAB — HIV ANTIBODY (ROUTINE TESTING W REFLEX): HIV Screen 4th Generation wRfx: NONREACTIVE

## 2024-04-02 LAB — RPR: RPR Ser Ql: NONREACTIVE

## 2024-04-05 ENCOUNTER — Encounter: Payer: Self-pay | Admitting: Radiology

## 2024-04-15 ENCOUNTER — Other Ambulatory Visit: Payer: Self-pay

## 2024-04-15 DIAGNOSIS — Z3A29 29 weeks gestation of pregnancy: Secondary | ICD-10-CM

## 2024-04-16 ENCOUNTER — Other Ambulatory Visit

## 2024-04-16 ENCOUNTER — Other Ambulatory Visit: Payer: Self-pay

## 2024-04-16 DIAGNOSIS — Z3A29 29 weeks gestation of pregnancy: Secondary | ICD-10-CM

## 2024-04-17 LAB — COMPREHENSIVE METABOLIC PANEL WITH GFR
ALT: 9 IU/L (ref 0–32)
AST: 6 IU/L (ref 0–40)
Albumin: 3.7 g/dL — ABNORMAL LOW (ref 4.0–5.0)
Alkaline Phosphatase: 82 IU/L (ref 41–116)
BUN/Creatinine Ratio: 17 (ref 9–23)
BUN: 8 mg/dL (ref 6–20)
Bilirubin Total: 0.4 mg/dL (ref 0.0–1.2)
CO2: 19 mmol/L — ABNORMAL LOW (ref 20–29)
Calcium: 9 mg/dL (ref 8.7–10.2)
Chloride: 102 mmol/L (ref 96–106)
Creatinine, Ser: 0.46 mg/dL — ABNORMAL LOW (ref 0.57–1.00)
Globulin, Total: 2.7 g/dL (ref 1.5–4.5)
Glucose: 126 mg/dL — ABNORMAL HIGH (ref 70–99)
Potassium: 3.7 mmol/L (ref 3.5–5.2)
Sodium: 137 mmol/L (ref 134–144)
Total Protein: 6.4 g/dL (ref 6.0–8.5)
eGFR: 139 mL/min/1.73 (ref >59)

## 2024-04-17 LAB — BILE ACIDS, TOTAL: Bile Acids Total: 2.3 umol/L (ref 0.0–10.0)

## 2024-04-19 ENCOUNTER — Ambulatory Visit: Payer: Self-pay | Admitting: Family Medicine

## 2024-04-22 ENCOUNTER — Encounter: Payer: Self-pay | Admitting: Family Medicine

## 2024-04-22 ENCOUNTER — Ambulatory Visit: Admitting: Family Medicine

## 2024-04-22 ENCOUNTER — Other Ambulatory Visit: Payer: Self-pay

## 2024-04-22 VITALS — BP 113/79 | HR 106 | Wt 226.0 lb

## 2024-04-22 DIAGNOSIS — Z3A30 30 weeks gestation of pregnancy: Secondary | ICD-10-CM

## 2024-04-22 DIAGNOSIS — Z3400 Encounter for supervision of normal first pregnancy, unspecified trimester: Secondary | ICD-10-CM

## 2024-04-22 DIAGNOSIS — O99212 Obesity complicating pregnancy, second trimester: Secondary | ICD-10-CM

## 2024-04-22 NOTE — Progress Notes (Signed)
 PRENATAL VISIT NOTE  Subjective:  Catherine Lawrence is a 23 y.o. G1P0 at [redacted]w[redacted]d being seen today for ongoing prenatal care.  She is currently monitored for the following issues for this low-risk pregnancy and has Episodic headache; Obesity affecting pregnancy, antepartum, second trimester; and Supervision of low-risk first pregnancy, unspecified trimester on their problem list.  Patient reports no complaints.  Contractions: Not present. Vag. Bleeding: None.  Movement: Present. Denies leaking of fluid.   The following portions of the patient's history were reviewed and updated as appropriate: allergies, current medications, past family history, past medical history, past social history, past surgical history and problem list.   Objective:   Vitals:   04/22/24 1036  BP: 113/79  Pulse: (!) 106  Weight: 102.5 kg    Fetal Status:  Fetal Heart Rate (bpm): 140 Fundal Height: 31 cm Movement: Present    General: Alert, oriented and cooperative. Patient is in no acute distress.  Skin: Skin is warm and dry. No rash noted.   Cardiovascular: Normal heart rate noted  Respiratory: Normal respiratory effort, no problems with respiration noted  Abdomen: Soft, gravid, appropriate for gestational age.  Pain/Pressure: Absent     Pelvic: Cervical exam deferred        Extremities: Normal range of motion.  Edema: Trace (BIL ankles)  Mental Status: Normal mood and affect. Normal behavior. Normal judgment and thought content.      12/11/2023   11:39 AM  Depression screen PHQ 2/9  Decreased Interest 2  Down, Depressed, Hopeless 0  PHQ - 2 Score 2  Altered sleeping 3  Tired, decreased energy 3  Change in appetite 3  Feeling bad or failure about yourself  0  Trouble concentrating 0  Moving slowly or fidgety/restless 0  Suicidal thoughts 0  PHQ-9 Score 11      Data saved with a previous flowsheet row definition        12/11/2023   11:39 AM  GAD 7 : Generalized Anxiety Score  Nervous,  Anxious, on Edge 2  Control/stop worrying 0  Worry too much - different things 2  Trouble relaxing 1  Restless 0  Easily annoyed or irritable 2  Afraid - awful might happen 1  Total GAD 7 Score 8    Assessment and Plan:  Pregnancy: G1P0 at [redacted]w[redacted]d  1. Supervision of low-risk first pregnancy, unspecified trimester (Primary) - Doing well, feeling regular and vigorous fetal movement  -BP normotensive  2. [redacted] weeks gestation of pregnancy - Routine OB care   3. Obesity affecting pregnancy, antepartum, second trimester -Taking Baby ASA - Follow up U/S on 12/4    Preterm labor symptoms and general obstetric precautions including but not limited to vaginal bleeding, contractions, leaking of fluid and fetal movement were reviewed in detail with the patient. Please refer to After Visit Summary for other counseling recommendations.   Return in about 2 weeks (around 05/06/2024) for DYAD- ROB.  Future Appointments  Date Time Provider Department Center  05/06/2024  9:15 AM [facility] PROVIDER 1 [facility]  05/06/2024  9:30 AM [facility] US3 [facility]  05/07/2024  8:15 AM [name], MD [facility]  05/21/2024  8:55 AM [name], MD [facility]  06/04/2024  8:15 AM [name], MD [facility]  06/11/2024  8:55 AM [name], MD [facility]  06/18/2024  8:55 AM [name], MD [facility]  06/25/2024  8:55 AM [name], MD [facility]  07/02/2024  8:55  AM Cresenzo, Norleen GAILS, MD [facility]    Derrek JINNY Freund, NP Student

## 2024-04-22 NOTE — Patient Instructions (Signed)

## 2024-04-23 ENCOUNTER — Encounter: Admitting: Obstetrics and Gynecology

## 2024-04-23 ENCOUNTER — Encounter: Admitting: Family Medicine

## 2024-05-06 ENCOUNTER — Ambulatory Visit: Attending: Maternal & Fetal Medicine

## 2024-05-06 ENCOUNTER — Ambulatory Visit (HOSPITAL_BASED_OUTPATIENT_CLINIC_OR_DEPARTMENT_OTHER): Admitting: Maternal & Fetal Medicine

## 2024-05-06 VITALS — BP 125/73 | HR 108

## 2024-05-06 DIAGNOSIS — O99212 Obesity complicating pregnancy, second trimester: Secondary | ICD-10-CM

## 2024-05-06 DIAGNOSIS — Z3A32 32 weeks gestation of pregnancy: Secondary | ICD-10-CM | POA: Insufficient documentation

## 2024-05-06 NOTE — Progress Notes (Signed)
   Patient information  Patient Name: Catherine Lawrence  Patient MRN:   [id number]  Referring practice: MFM Referring Provider: [facility])  Problem List   Patient Active Problem List   Diagnosis Date Noted   Obesity affecting pregnancy, antepartum, second trimester 12/04/2023   Supervision of low-risk first pregnancy, unspecified trimester 12/04/2023   Episodic headache 11/23/2023    Maternal Fetal medicine Consult  Catherine Lawrence is a 23 y.o. G1P0 at [redacted]w[redacted]d here for ultrasound and consultation. Catherine Lawrence is doing well today with no acute concerns. Today we focused on the following:   The patient is here for growth ultrasound due to elevated BMI.  The estimated fetal weight is overall at the 80th percentile but the [facility] is at the 91% overall. For this reason she will return for growth ultrasound in approximately 4 to 5 weeks.  I discussed the importance of proper diet and exercise during pregnancy to limit the fetal growth.  She passed her glucose test.  The patient had time to ask questions that were answered to her satisfaction.  She verbalized understanding and agrees to proceed with the plan below.  Standard OB precautions were given to the patient.  Recommendations - F/u growth in 4-5 weeks due to elevated BMI and large fetal growth (74% overall with the AC at the 91%)  I spent 20 minutes reviewing the patients chart, including labs and images as well as counseling the patient about her medical conditions. Greater than 50% of the time was spent in direct face-to-face patient counseling.  Catherine Lawrence  MFM, [place]   05/06/2024  11:49 AM   Review of Systems: A review of systems was performed and was negative except per HPI   Vitals and Physical Exam    05/06/2024    9:27 AM 04/22/2024   10:36 AM 04/01/2024    8:42 AM  Vitals with BMI  Weight  226 lbs 223 lbs 2 oz  Systolic 125 113 882  Diastolic 73 79 81  Pulse 108  893 97    Sitting comfortably on the sonogram table Nonlabored breathing Normal rate and rhythm Abdomen is nontender  Past pregnancies OB History  Gravida Para Term Preterm AB Living  1       SAB IAB Ectopic Multiple Live Births          # Outcome Date GA Lbr Len/2nd Weight Sex Type Anes PTL Lv  1 Current              Future Appointments  Date Time Provider Department Center  05/21/2024  8:55 AM Catherine Norleen GAILS, MD [facility]  06/04/2024  8:15 AM Catherine Norleen GAILS, MD [facility]  06/11/2024  8:55 AM Catherine Norleen GAILS, MD [facility]  06/18/2024  8:55 AM Catherine Donnice HERO, MD [facility]  06/18/2024  9:15 AM [facility] PROVIDER 1 [facility]  06/18/2024  9:30 AM [facility] US1 [facility]  06/25/2024  8:55 AM Catherine Suzen Octave, MD [facility]  07/02/2024  8:55 AM Catherine, Norleen GAILS, MD [facility]

## 2024-05-07 ENCOUNTER — Encounter

## 2024-05-07 ENCOUNTER — Encounter: Admitting: Family Medicine

## 2024-05-21 ENCOUNTER — Ambulatory Visit (INDEPENDENT_AMBULATORY_CARE_PROVIDER_SITE_OTHER): Admitting: Family Medicine

## 2024-05-21 ENCOUNTER — Other Ambulatory Visit (HOSPITAL_COMMUNITY)
Admission: RE | Admit: 2024-05-21 | Discharge: 2024-05-21 | Disposition: A | Discharge status: Home / Self Care | Source: Ambulatory Visit | Attending: Family Medicine | Admitting: Family Medicine

## 2024-05-21 ENCOUNTER — Encounter

## 2024-05-21 ENCOUNTER — Other Ambulatory Visit: Payer: Self-pay

## 2024-05-21 VITALS — BP 125/84 | HR 106 | Wt 233.1 lb

## 2024-05-21 DIAGNOSIS — Z3400 Encounter for supervision of normal first pregnancy, unspecified trimester: Secondary | ICD-10-CM | POA: Diagnosis present

## 2024-05-21 DIAGNOSIS — N898 Other specified noninflammatory disorders of vagina: Secondary | ICD-10-CM | POA: Insufficient documentation

## 2024-05-21 DIAGNOSIS — O99213 Obesity complicating pregnancy, third trimester: Secondary | ICD-10-CM

## 2024-05-21 DIAGNOSIS — O99212 Obesity complicating pregnancy, second trimester: Secondary | ICD-10-CM

## 2024-05-21 DIAGNOSIS — O26893 Other specified pregnancy related conditions, third trimester: Secondary | ICD-10-CM | POA: Insufficient documentation

## 2024-05-21 DIAGNOSIS — R350 Frequency of micturition: Secondary | ICD-10-CM

## 2024-05-21 DIAGNOSIS — Z3A34 34 weeks gestation of pregnancy: Secondary | ICD-10-CM

## 2024-05-21 NOTE — Progress Notes (Signed)
 "  PRENATAL VISIT NOTE  Subjective:  Catherine Lawrence is a 23 y.o. G1P0 at [redacted]w[redacted]d being seen today for ongoing prenatal care.  She is currently monitored for the following issues for this low-risk pregnancy and has Episodic headache; Obesity affecting pregnancy, antepartum, second trimester; and Supervision of low-risk first pregnancy, unspecified trimester on their problem list.  Patient reports no bleeding, no contractions, no cramping, and no leaking.  Contractions: Not present. Vag. Bleeding: None.  Movement: (!) Decreased (for the last 2 days). Denies leaking of fluid.   The following portions of the patient's history were reviewed and updated as appropriate: allergies, current medications, past family history, past medical history, past social history, past surgical history and problem list.   Objective:   Vitals:   05/21/24 0905  BP: 125/84  Pulse: (!) 106  Weight: 233 lb 2 oz (105.7 kg)    Fetal Status:  Fetal Heart Rate (bpm): 139   Movement: (!) Decreased (for the last 2 days)    General: Alert, oriented and cooperative. Patient is in no acute distress.  Skin: Skin is warm and dry. No rash noted.   Cardiovascular: Normal heart rate noted  Respiratory: Normal respiratory effort, no problems with respiration noted  Abdomen: Soft, gravid, appropriate for gestational age.  Pain/Pressure: Present     Pelvic: Cervical exam deferred        Extremities: Normal range of motion.  Edema: None  Mental Status: Normal mood and affect. Normal behavior. Normal judgment and thought content.      12/11/2023   11:39 AM  Depression screen PHQ 2/9  Decreased Interest 2  Down, Depressed, Hopeless 0  PHQ - 2 Score 2  Altered sleeping 3  Tired, decreased energy 3  Change in appetite 3  Feeling bad or failure about yourself  0  Trouble concentrating 0  Moving slowly or fidgety/restless 0  Suicidal thoughts 0  PHQ-9 Score 11      Data saved with a previous flowsheet row definition         12/11/2023   11:39 AM  GAD 7 : Generalized Anxiety Score  Nervous, Anxious, on Edge 2  Control/stop worrying 0  Worry too much - different things 2  Trouble relaxing 1  Restless 0  Easily annoyed or irritable 2  Afraid - awful might happen 1  Total GAD 7 Score 8    Assessment and Plan:  Pregnancy: G1P0 at [redacted]w[redacted]d 1. Supervision of low-risk first pregnancy, unspecified trimester (Primary) FHR BP appropriate Had lengthy discussion about kick counts.  Patient reports she is feeling less movement than earlier in the pregnancy but is unsure if it is 5 every hour 10 and 2.  Discussed how to do kick counts and if she does not return in 2 hours she should go to the MAU for evaluation.  2. Obesity affecting pregnancy, antepartum, second trimester On ASA  3. [redacted] weeks gestation of pregnancy Patient does report increased vaginal discharge so wet prep collected.  Also complaining of increased urinary frequency and so urine culture collected.  Preterm labor symptoms and general obstetric precautions including but not limited to vaginal bleeding, contractions, leaking of fluid and fetal movement were reviewed in detail with the patient. Please refer to After Visit Summary for other counseling recommendations.   No follow-ups on file.  Future Appointments  Date Time Provider Department Center  06/04/2024  8:15 AM [name], MD [facility]  06/11/2024  8:55 AM [name], MD [facility]  06/18/2024  8:55 AM [name], MD [facility]  06/18/2024  9:15 AM [facility] PROVIDER 1 [facility]  06/18/2024  9:30 AM [facility] US1 [facility]  06/25/2024  8:55 AM [name], MD [facility]  07/02/2024  8:55 AM Ilean, Norleen GAILS, MD [facility]    Norleen GAILS Ilean, MD  "

## 2024-05-23 LAB — URINE CULTURE, OB REFLEX

## 2024-05-23 LAB — CULTURE, OB URINE

## 2024-05-24 LAB — CERVICOVAGINAL ANCILLARY ONLY
Bacterial Vaginitis (gardnerella): NEGATIVE
Candida Glabrata: NEGATIVE
Candida Vaginitis: NEGATIVE
Chlamydia: NEGATIVE
Comment: NEGATIVE
Comment: NEGATIVE
Comment: NEGATIVE
Comment: NEGATIVE
Comment: NEGATIVE
Comment: NORMAL
Neisseria Gonorrhea: NEGATIVE
Trichomonas: NEGATIVE

## 2024-05-24 LAB — POCT URINALYSIS DIP (DEVICE)
Bilirubin Urine: NEGATIVE
Glucose, UA: NEGATIVE mg/dL
Hgb urine dipstick: NEGATIVE
Ketones, ur: NEGATIVE mg/dL
Leukocytes,Ua: NEGATIVE
Nitrite: NEGATIVE
Protein, ur: NEGATIVE mg/dL
Specific Gravity, Urine: 1.025 (ref 1.005–1.030)
Urobilinogen, UA: 0.2 mg/dL (ref 0.0–1.0)
pH: 6 (ref 5.0–8.0)

## 2024-06-01 NOTE — Progress Notes (Unsigned)
 "   PRENATAL VISIT NOTE  Subjective:  Catherine Lawrence is a 23 y.o. G1P0 at [redacted]w[redacted]d being seen today for ongoing prenatal care.  She is currently monitored for the following issues for this low-risk pregnancy and has Episodic headache; Obesity affecting pregnancy, antepartum, second trimester; and Supervision of low-risk first pregnancy, unspecified trimester on their problem list.  Patient reports no complaints.  Contractions: Irritability. Vag. Bleeding: None.  Movement: Present. Denies leaking of fluid.   The following portions of the patient's history were reviewed and updated as appropriate: allergies, current medications, past family history, past medical history, past social history, past surgical history and problem list.   Objective:   Vitals:   06/02/24 1443  BP: 122/88  Pulse: (!) 101  Weight: 239 lb (108.4 kg)    Fetal Status:  Fetal Heart Rate (bpm): 150   Movement: Present    General: Alert, oriented and cooperative. Patient is in no acute distress.  Skin: Skin is warm and dry. No rash noted.   Cardiovascular: Normal heart rate noted  Respiratory: Normal respiratory effort, no problems with respiration noted  Abdomen: Soft, gravid, appropriate for gestational age.  Pain/Pressure: Present (pressure/cramp pelvic round ligament pain)     Pelvic: Cervical exam deferred        Extremities: Normal range of motion.  Edema: Trace (Bil LE)  Mental Status: Normal mood and affect. Normal behavior. Normal judgment and thought content.      12/11/2023   11:39 AM  Depression screen PHQ 2/9  Decreased Interest 2  Down, Depressed, Hopeless 0  PHQ - 2 Score 2  Altered sleeping 3  Tired, decreased energy 3  Change in appetite 3  Feeling bad or failure about yourself  0  Trouble concentrating 0  Moving slowly or fidgety/restless 0  Suicidal thoughts 0  PHQ-9 Score 11      Data saved with a previous flowsheet row definition        12/11/2023   11:39 AM  GAD 7 :  Generalized Anxiety Score  Nervous, Anxious, on Edge 2  Control/stop worrying 0  Worry too much - different things 2  Trouble relaxing Lawrence  Restless 0  Easily annoyed or irritable 2  Afraid - awful might happen Lawrence  Total GAD 7 Score 8    Assessment and Plan:  Pregnancy: G1P0 at [redacted]w[redacted]d Lawrence. Supervision of low-risk first pregnancy, unspecified trimester (Primary) Up to date Vigorous movement 36 wk swabs today Concerns: - some SOB with sitting- Reviewed, no red flags - RSV vaccine was offered- accepted today - size of baby- last growth was 74th% , EFW at 39 weeks 3715g, AC 91%. Wondering about IOL. Has repeat US  on Lawrence/16  Orders Placed This Encounter  Procedures   Culture, beta strep (group b only)   Respiratory syncytial virus vaccine, preF, subunit, bivalent,(Abrysvo)    2. Obesity affecting pregnancy, antepartum, second trimester TWG=36 lb (16.3 kg)   Preterm labor symptoms and general obstetric precautions including but not limited to vaginal bleeding, contractions, leaking of fluid and fetal movement were reviewed in detail with the patient. Please refer to After Visit Summary for other counseling recommendations.   No follow-ups on file.  Future Appointments  Date Time Provider Department Center  Lawrence/02/2025  8:55 AM Catherine Norleen GAILS, MD [facility]  Lawrence/16/2026  8:55 AM Catherine Donnice HERO, MD [facility]  Lawrence/16/2026  9:15 AM Catherine Lawrence Catherine [facility]  Lawrence/16/2026  9:30 AM Catherine US1 [facility]  Lawrence/23/2026  8:55 AM Catherine Catherine Octave, MD [facility]  Lawrence/30/2026  8:55 AM Catherine, Norleen GAILS, MD [facility]    Catherine Lawrence Eldonna, MD "

## 2024-06-02 ENCOUNTER — Other Ambulatory Visit: Payer: Self-pay

## 2024-06-02 ENCOUNTER — Ambulatory Visit: Admitting: Family Medicine

## 2024-06-02 VITALS — BP 122/88 | HR 101 | Wt 239.0 lb

## 2024-06-02 DIAGNOSIS — O99213 Obesity complicating pregnancy, third trimester: Secondary | ICD-10-CM

## 2024-06-02 DIAGNOSIS — Z3A36 36 weeks gestation of pregnancy: Secondary | ICD-10-CM

## 2024-06-02 DIAGNOSIS — Z3400 Encounter for supervision of normal first pregnancy, unspecified trimester: Secondary | ICD-10-CM

## 2024-06-02 DIAGNOSIS — Z23 Encounter for immunization: Secondary | ICD-10-CM | POA: Diagnosis not present

## 2024-06-02 DIAGNOSIS — Z2911 Encounter for prophylactic immunotherapy for respiratory syncytial virus (RSV): Secondary | ICD-10-CM | POA: Diagnosis not present

## 2024-06-02 DIAGNOSIS — O99212 Obesity complicating pregnancy, second trimester: Secondary | ICD-10-CM

## 2024-06-03 ENCOUNTER — Encounter (HOSPITAL_COMMUNITY): Payer: Self-pay | Admitting: Obstetrics and Gynecology

## 2024-06-03 ENCOUNTER — Other Ambulatory Visit: Payer: Self-pay

## 2024-06-03 ENCOUNTER — Inpatient Hospital Stay (HOSPITAL_COMMUNITY)
Admission: AD | Admit: 2024-06-03 | Discharge: 2024-06-03 | Disposition: A | Discharge status: Home / Self Care | Attending: Obstetrics and Gynecology | Admitting: Obstetrics and Gynecology

## 2024-06-03 DIAGNOSIS — O368130 Decreased fetal movements, third trimester, not applicable or unspecified: Secondary | ICD-10-CM | POA: Insufficient documentation

## 2024-06-03 DIAGNOSIS — R000 Tachycardia, unspecified: Secondary | ICD-10-CM | POA: Diagnosis not present

## 2024-06-03 DIAGNOSIS — Z3A36 36 weeks gestation of pregnancy: Secondary | ICD-10-CM

## 2024-06-03 DIAGNOSIS — O26893 Other specified pregnancy related conditions, third trimester: Secondary | ICD-10-CM | POA: Diagnosis not present

## 2024-06-03 DIAGNOSIS — J101 Influenza due to other identified influenza virus with other respiratory manifestations: Secondary | ICD-10-CM | POA: Diagnosis not present

## 2024-06-03 DIAGNOSIS — O98513 Other viral diseases complicating pregnancy, third trimester: Secondary | ICD-10-CM | POA: Insufficient documentation

## 2024-06-03 DIAGNOSIS — O99891 Other specified diseases and conditions complicating pregnancy: Secondary | ICD-10-CM | POA: Insufficient documentation

## 2024-06-03 DIAGNOSIS — R030 Elevated blood-pressure reading, without diagnosis of hypertension: Secondary | ICD-10-CM

## 2024-06-03 DIAGNOSIS — Z87891 Personal history of nicotine dependence: Secondary | ICD-10-CM | POA: Diagnosis not present

## 2024-06-03 DIAGNOSIS — O99513 Diseases of the respiratory system complicating pregnancy, third trimester: Secondary | ICD-10-CM | POA: Insufficient documentation

## 2024-06-03 DIAGNOSIS — O212 Late vomiting of pregnancy: Secondary | ICD-10-CM | POA: Insufficient documentation

## 2024-06-03 LAB — URINALYSIS, ROUTINE W REFLEX MICROSCOPIC
Bilirubin Urine: NEGATIVE
Glucose, UA: NEGATIVE mg/dL
Hgb urine dipstick: NEGATIVE
Ketones, ur: NEGATIVE mg/dL
Nitrite: NEGATIVE
Protein, ur: 30 mg/dL — AB
Specific Gravity, Urine: 1.014 (ref 1.005–1.030)
pH: 5 (ref 5.0–8.0)

## 2024-06-03 LAB — CBC WITH DIFFERENTIAL/PLATELET
Abs Immature Granulocytes: 0.06 K/uL (ref 0.00–0.07)
Basophils Absolute: 0 K/uL (ref 0.0–0.1)
Basophils Relative: 0 %
Eosinophils Absolute: 0 K/uL (ref 0.0–0.5)
Eosinophils Relative: 0 %
HCT: 38.9 % (ref 36.0–46.0)
Hemoglobin: 13.4 g/dL (ref 12.0–15.0)
Immature Granulocytes: 1 %
Lymphocytes Relative: 5 %
Lymphs Abs: 0.5 K/uL — ABNORMAL LOW (ref 0.7–4.0)
MCH: 31.2 pg (ref 26.0–34.0)
MCHC: 34.4 g/dL (ref 30.0–36.0)
MCV: 90.5 fL (ref 80.0–100.0)
Monocytes Absolute: 0.4 K/uL (ref 0.1–1.0)
Monocytes Relative: 4 %
Neutro Abs: 8.1 K/uL — ABNORMAL HIGH (ref 1.7–7.7)
Neutrophils Relative %: 90 %
Platelets: 214 K/uL (ref 150–400)
RBC: 4.3 MIL/uL (ref 3.87–5.11)
RDW: 13 % (ref 11.5–15.5)
WBC: 9.1 K/uL (ref 4.0–10.5)
nRBC: 0 % (ref 0.0–0.2)

## 2024-06-03 LAB — COMPREHENSIVE METABOLIC PANEL WITH GFR
ALT: 13 U/L (ref 0–44)
AST: 23 U/L (ref 15–41)
Albumin: 3.8 g/dL (ref 3.5–5.0)
Alkaline Phosphatase: 125 U/L (ref 38–126)
Anion gap: 13 (ref 5–15)
BUN: 6 mg/dL (ref 6–20)
CO2: 20 mmol/L — ABNORMAL LOW (ref 22–32)
Calcium: 9.4 mg/dL (ref 8.9–10.3)
Chloride: 101 mmol/L (ref 98–111)
Creatinine, Ser: 0.57 mg/dL (ref 0.44–1.00)
GFR, Estimated: >60 mL/min
Glucose, Bld: 91 mg/dL (ref 70–99)
Potassium: 3.9 mmol/L (ref 3.5–5.1)
Sodium: 135 mmol/L (ref 135–145)
Total Bilirubin: 0.6 mg/dL (ref 0.0–1.2)
Total Protein: 7.2 g/dL (ref 6.5–8.1)

## 2024-06-03 LAB — RESP PANEL BY RT-PCR (RSV, FLU A&B, COVID)  RVPGX2
Influenza A by PCR: POSITIVE — AB
Influenza B by PCR: NEGATIVE
Resp Syncytial Virus by PCR: NEGATIVE
SARS Coronavirus 2 by RT PCR: NEGATIVE

## 2024-06-03 LAB — PROTEIN / CREATININE RATIO, URINE
Creatinine, Urine: 128 mg/dL
Protein Creatinine Ratio: 0.2 mg/mg — ABNORMAL HIGH
Total Protein, Urine: 26 mg/dL

## 2024-06-03 LAB — LACTIC ACID, PLASMA: Lactic Acid, Venous: 1.1 mmol/L (ref 0.5–1.9)

## 2024-06-03 MED ORDER — OSELTAMIVIR PHOSPHATE 75 MG PO CAPS: 75.0000 mg | ORAL_CAPSULE | Freq: Two times a day (BID) | ORAL | 0 refills | Status: DC

## 2024-06-03 MED ORDER — GUAIFENESIN 100 MG/5ML PO LIQD
15.0000 mL | Freq: Once | ORAL | Status: AC
  Administered 2024-06-03: 15 mL via ORAL
  Filled 2024-06-03: qty 15

## 2024-06-03 MED ORDER — LACTATED RINGERS IV BOLUS
1000.0000 mL | Freq: Once | INTRAVENOUS | Status: AC
  Administered 2024-06-03: 1000 mL via INTRAVENOUS

## 2024-06-03 MED ORDER — ACETAMINOPHEN 500 MG PO TABS
1000.0000 mg | ORAL_TABLET | Freq: Once | ORAL | Status: AC
  Administered 2024-06-03: 1000 mg via ORAL
  Filled 2024-06-03: qty 2

## 2024-06-03 MED ORDER — MENTHOL 3 MG MT LOZG
1.0000 | LOZENGE | Freq: Once | OROMUCOSAL | Status: AC
  Administered 2024-06-03: 3 mg via ORAL
  Filled 2024-06-03: qty 9

## 2024-06-03 MED ORDER — ONDANSETRON HCL 4 MG/2ML IJ SOLN
4.0000 mg | Freq: Once | INTRAMUSCULAR | Status: AC
  Administered 2024-06-03: 4 mg via INTRAVENOUS
  Filled 2024-06-03: qty 2

## 2024-06-03 NOTE — MAU Provider Note (Signed)
 " History     CSN: [id number]  Arrival date and time: 06/03/24 1059   Event Date/Time   First Provider Initiated Contact with Patient 06/03/24 1157      Chief Complaint  Patient presents with   Emesis   Sore Throat   HPI Catherine Lawrence is a 24 y.o. G1P0 at [redacted]w[redacted]d who presents with sore throat, chills, & vomiting.  Symptoms started yesterday. States her mom has been sick with the flu.  Other symptoms include chills, body aches, & congestion. Took 2 ES tylenol this morning. Hasn't taken anything else for symptoms.  Denies contractions, LOF, or vaginal bleeding.  Reports decreased fetal movement since waking up this morning.   OB History     Gravida  1   Para      Term      Preterm      AB      Living         SAB      IAB      Ectopic      Multiple      Live Births              Past Medical History:  Diagnosis Date   Anemia    Anxiety    Depression    hx of depression    Eczema    Headache    Low calcium levels    Palpitations    has not had work up    Past Surgical History:  Procedure Laterality Date   NO PAST SURGERIES      Family History  Problem Relation Age of Onset   Diabetes Father    Diabetes Mother    Hypertension Mother    Hyperthyroidism Mother    Migraines Neg Hx     Social History[1]  Allergies: Allergies[2]  No medications prior to admission.    Review of Systems  All other systems reviewed and are negative.  Physical Exam   Blood pressure (!) 139/93, pulse (!) 132, temperature 99.7 F (37.6 C), temperature source Oral, height 5' 3.5 (1.613 m), last menstrual period 09/13/2023, SpO2 99%.  Physical Exam Vitals and nursing note reviewed.  Constitutional:      General: She is not in acute distress.    Appearance: She is well-developed. She is not ill-appearing.  HENT:     Head: Normocephalic and atraumatic.     Mouth/Throat:     Pharynx: No oropharyngeal exudate or posterior oropharyngeal erythema.   Eyes:     General: No scleral icterus.       Right eye: No discharge.        Left eye: No discharge.     Conjunctiva/sclera: Conjunctivae normal.  Cardiovascular:     Rate and Rhythm: Regular rhythm. Tachycardia present.  Pulmonary:     Effort: Pulmonary effort is normal. No respiratory distress.     Breath sounds: Normal breath sounds. No wheezing or rhonchi.  Neurological:     General: No focal deficit present.     Mental Status: She is alert.  Psychiatric:        Mood and Affect: Mood normal.        Behavior: Behavior normal.    NST:  Baseline: 150 bpm, Variability: Good {> 6 bpm), Accelerations: Reactive, and Decelerations: Absent  MAU Course  Procedures Results for orders placed or performed during the hospital encounter of 06/03/24 (from the past 24 hours)  Resp panel by RT-PCR (RSV, Flu A&B, Covid) Anterior Nasal Swab  Status: Abnormal   Collection Time: 06/03/24 11:37 AM   Specimen: Anterior Nasal Swab  Result Value Ref Range   SARS Coronavirus 2 by RT PCR NEGATIVE NEGATIVE   Influenza A by PCR POSITIVE (A) NEGATIVE   Influenza B by PCR NEGATIVE NEGATIVE   Resp Syncytial Virus by PCR NEGATIVE NEGATIVE  Urinalysis, Routine w reflex microscopic -Urine, Clean Catch     Status: Abnormal   Collection Time: 06/03/24 11:42 AM  Result Value Ref Range   Color, Urine YELLOW YELLOW   APPearance HAZY (A) CLEAR   Specific Gravity, Urine 1.014 1.005 - 1.030   pH 5.0 5.0 - 8.0   Glucose, UA NEGATIVE NEGATIVE mg/dL   Hgb urine dipstick NEGATIVE NEGATIVE   Bilirubin Urine NEGATIVE NEGATIVE   Ketones, ur NEGATIVE NEGATIVE mg/dL   Protein, ur 30 (A) NEGATIVE mg/dL   Nitrite NEGATIVE NEGATIVE   Leukocytes,Ua TRACE (A) NEGATIVE   RBC / HPF 0-5 0 - 5 RBC/hpf   WBC, UA 0-5 0 - 5 WBC/hpf   Bacteria, UA RARE (A) NONE SEEN   Squamous Epithelial / HPF 6-10 0 - 5 /HPF   Mucus PRESENT   Protein / creatinine ratio, urine     Status: Abnormal   Collection Time: 06/03/24 11:42 AM   Result Value Ref Range   Creatinine, Urine 128 mg/dL   Total Protein, Urine 26 mg/dL   Protein Creatinine Ratio 0.2 (H) <0.2 mg/mg  CBC with Differential/Platelet     Status: Abnormal   Collection Time: 06/03/24 12:35 PM  Result Value Ref Range   WBC 9.1 4.0 - 10.5 K/uL   RBC 4.30 3.87 - 5.11 MIL/uL   Hemoglobin 13.4 12.0 - 15.0 g/dL   HCT 61.0 63.9 - 53.9 %   MCV 90.5 80.0 - 100.0 fL   MCH 31.2 26.0 - 34.0 pg   MCHC 34.4 30.0 - 36.0 g/dL   RDW 86.9 88.4 - 84.4 %   Platelets 214 150 - 400 K/uL   nRBC 0.0 0.0 - 0.2 %   Neutrophils Relative % 90 %   Neutro Abs 8.1 (H) 1.7 - 7.7 K/uL   Lymphocytes Relative 5 %   Lymphs Abs 0.5 (L) 0.7 - 4.0 K/uL   Monocytes Relative 4 %   Monocytes Absolute 0.4 0.1 - 1.0 K/uL   Eosinophils Relative 0 %   Eosinophils Absolute 0.0 0.0 - 0.5 K/uL   Basophils Relative 0 %   Basophils Absolute 0.0 0.0 - 0.1 K/uL   Immature Granulocytes 1 %   Abs Immature Granulocytes 0.06 0.00 - 0.07 K/uL  Comprehensive metabolic panel with GFR     Status: Abnormal   Collection Time: 06/03/24 12:35 PM  Result Value Ref Range   Sodium 135 135 - 145 mmol/L   Potassium 3.9 3.5 - 5.1 mmol/L   Chloride 101 98 - 111 mmol/L   CO2 20 (L) 22 - 32 mmol/L   Glucose, Bld 91 70 - 99 mg/dL   BUN 6 6 - 20 mg/dL   Creatinine, Ser 9.42 0.44 - 1.00 mg/dL   Calcium 9.4 8.9 - 89.6 mg/dL   Total Protein 7.2 6.5 - 8.1 g/dL   Albumin 3.8 3.5 - 5.0 g/dL   AST 23 15 - 41 U/L   ALT 13 0 - 44 U/L   Alkaline Phosphatase 125 38 - 126 U/L   Total Bilirubin 0.6 0.0 - 1.2 mg/dL   GFR, Estimated >39 >39 mL/min   Anion gap 13 5 - 15  Lactic acid, plasma     Status: None   Collection Time: 06/03/24 12:35 PM  Result Value Ref Range   Lactic Acid, Venous 1.1 0.5 - 1.9 mmol/L    MDM   Assessment and Plan   1. Influenza A  -Resp panel positive for flu A -Treated in MAU with IV fluids, tylenol, robitussin, & zofran .  -Remained tachycardic during visit. Temp & symptoms improved -Rx  tamiflu & reviewed pregnancy safe OTC meds for treatment  2. Elevated BP reading w/ no diagnosis of HTN  -Multiple MRBPs in triage. Pre eclampsia labs normal -Message sent to schedule BP check in office on Monday -Reviewed preeclampsia precautions  3. [redacted] weeks gestation of pregnancy      Rocky Satterfield 06/03/2024, 5:39 PM       [1]  Social History Tobacco Use   Smoking status: Never   Smokeless tobacco: Never   Tobacco comments:    Quit vaping with +preg  Vaping Use   Vaping status: Former   Substances: Nicotine  Substance Use Topics   Alcohol use: Not Currently    Comment: socially   Drug use: Never  [2]  Allergies Allergen Reactions   Aleve Arthritis Pain [Diclofenac Sodium] Rash   "

## 2024-06-03 NOTE — L&D Delivery Note (Signed)
 Delivery Note Catherine Lawrence is a 24 y.o. G1P1001 at [redacted]w[redacted]d admitted for IOL for GHTN.   GBS Status:  Positive/-- (12/31 1600)  Labor course: Initial SVE: LTC. Augmentation with: AROM, Pitocin , Cytotec , and IP Foley. She then progressed to complete.  ROM: 11h 45m with clear fluid  Birth: After a 30 minute 2nd stage, she delivered a Live born female  Birth Weight:   APGAR: 9,   Newborn Delivery   Birth date/time: [birth year]2024 21:06:48 Delivery type: Vaginal, Spontaneous        Delivered via spontaneous vaginal delivery (Presentation: OA ). Nuchal cord present: No. . Shoulders and body delivered in usual fashion. Infant placed directly on mom's abdomen for bonding/skin-to-skin, baby dried and stimulated. Cord clamped x 2 after 1 minute and cut by FOB.  Cord blood collected. Placenta delivered-Spontaneous with 3 vessels. 20u Pitocin  in 500cc LR given as a bolus prior to delivery of placenta.  Fundus firm with massage. Placenta inspected and appears to be intact with a 3 VC.  Sponge and instrument count were correct x2. After delivery, creatinine noted to be 1.31. Plan to start Magnesium .  Intrapartum complications:  None Anesthesia:  epidural Lacerations:  2nd degree Suture Repair: 2-0 Vicryl EBL (mL):553.00   Newborn Data: Birth date:06[birth year]20[birth year] Birth time:9:06 PM Gender:Female Living status:Living Apgars:9 ,9  Weight:3459 g    After delivery, creatine noted to be 1.31>Magnesium  started.   Mom to postpartum.  Baby to Couplet care / Skin to Skin. Placenta to L&D   Plans to Breast and bottlefeed Contraception: condoms Circumcision: declines  Note sent to [facility]: [facility] for pp visit.  Delivery Report:   Review the Delivery Report for details.     Signed: Cathlean Ely, DNP,CNM 06/11/2024, 12:50 AM

## 2024-06-03 NOTE — MAU Note (Signed)
 Catherine Lawrence is a 24 y.o. at [redacted]w[redacted]d here in MAU reporting: has had a sore throat for the past two days. Got the RSV vaccine yesterday and since this morning can't keep anything down. Took 1000mg  tylenol at 1000.   Denies VB, LOF, ctx. States baby was moving well this morning but it has been less over the past two hours.    Pain score: 7/10 throat Vitals:   06/03/24 1129  BP: 128/75  Pulse: (!) 138  Temp: 99.4 F (37.4 C)  SpO2: 99%     FHT: 165  Lab orders placed from triage: UA, resp. panel.

## 2024-06-04 ENCOUNTER — Encounter

## 2024-06-04 ENCOUNTER — Encounter: Admitting: Family Medicine

## 2024-06-05 ENCOUNTER — Ambulatory Visit: Payer: Self-pay | Admitting: Family Medicine

## 2024-06-05 ENCOUNTER — Encounter: Payer: Self-pay | Admitting: Obstetrics and Gynecology

## 2024-06-05 ENCOUNTER — Encounter: Payer: Self-pay | Admitting: Family Medicine

## 2024-06-05 DIAGNOSIS — B951 Streptococcus, group B, as the cause of diseases classified elsewhere: Secondary | ICD-10-CM | POA: Insufficient documentation

## 2024-06-05 DIAGNOSIS — O99820 Streptococcus B carrier state complicating pregnancy: Secondary | ICD-10-CM | POA: Insufficient documentation

## 2024-06-05 LAB — CULTURE, BETA STREP (GROUP B ONLY): Strep Gp B Culture: POSITIVE — AB

## 2024-06-08 ENCOUNTER — Ambulatory Visit (INDEPENDENT_AMBULATORY_CARE_PROVIDER_SITE_OTHER): Payer: Self-pay

## 2024-06-08 ENCOUNTER — Encounter (HOSPITAL_COMMUNITY): Payer: Self-pay | Admitting: Obstetrics & Gynecology

## 2024-06-08 ENCOUNTER — Inpatient Hospital Stay (HOSPITAL_COMMUNITY)
Admission: AD | Admit: 2024-06-08 | Discharge: 2024-06-12 | DRG: 807 | Disposition: A | Payer: Self-pay | Attending: Obstetrics and Gynecology | Admitting: Obstetrics and Gynecology

## 2024-06-08 ENCOUNTER — Other Ambulatory Visit: Payer: Self-pay

## 2024-06-08 VITALS — BP 149/77 | HR 92 | Wt 246.2 lb

## 2024-06-08 DIAGNOSIS — O134 Gestational [pregnancy-induced] hypertension without significant proteinuria, complicating childbirth: Principal | ICD-10-CM | POA: Diagnosis present

## 2024-06-08 DIAGNOSIS — Z7982 Long term (current) use of aspirin: Secondary | ICD-10-CM

## 2024-06-08 DIAGNOSIS — O1410 Severe pre-eclampsia, unspecified trimester: Secondary | ICD-10-CM | POA: Diagnosis not present

## 2024-06-08 DIAGNOSIS — O99214 Obesity complicating childbirth: Secondary | ICD-10-CM | POA: Diagnosis present

## 2024-06-08 DIAGNOSIS — Z8249 Family history of ischemic heart disease and other diseases of the circulatory system: Secondary | ICD-10-CM

## 2024-06-08 DIAGNOSIS — O1415 Severe pre-eclampsia, complicating the puerperium: Secondary | ICD-10-CM | POA: Diagnosis not present

## 2024-06-08 DIAGNOSIS — E66813 Obesity, class 3: Secondary | ICD-10-CM | POA: Diagnosis present

## 2024-06-08 DIAGNOSIS — Z833 Family history of diabetes mellitus: Secondary | ICD-10-CM | POA: Diagnosis not present

## 2024-06-08 DIAGNOSIS — Z3A37 37 weeks gestation of pregnancy: Secondary | ICD-10-CM

## 2024-06-08 DIAGNOSIS — O99824 Streptococcus B carrier state complicating childbirth: Secondary | ICD-10-CM | POA: Diagnosis present

## 2024-06-08 DIAGNOSIS — Z0130 Encounter for examination of blood pressure without abnormal findings: Secondary | ICD-10-CM

## 2024-06-08 DIAGNOSIS — Z3400 Encounter for supervision of normal first pregnancy, unspecified trimester: Secondary | ICD-10-CM

## 2024-06-08 DIAGNOSIS — O99820 Streptococcus B carrier state complicating pregnancy: Secondary | ICD-10-CM

## 2024-06-08 DIAGNOSIS — O139 Gestational [pregnancy-induced] hypertension without significant proteinuria, unspecified trimester: Secondary | ICD-10-CM | POA: Diagnosis present

## 2024-06-08 DIAGNOSIS — Z87891 Personal history of nicotine dependence: Secondary | ICD-10-CM

## 2024-06-08 DIAGNOSIS — O1413 Severe pre-eclampsia, third trimester: Secondary | ICD-10-CM

## 2024-06-08 DIAGNOSIS — R030 Elevated blood-pressure reading, without diagnosis of hypertension: Secondary | ICD-10-CM

## 2024-06-08 DIAGNOSIS — O133 Gestational [pregnancy-induced] hypertension without significant proteinuria, third trimester: Secondary | ICD-10-CM

## 2024-06-08 LAB — CBC
HCT: 36 % (ref 36.0–46.0)
Hemoglobin: 12.5 g/dL (ref 12.0–15.0)
MCH: 30.6 pg (ref 26.0–34.0)
MCHC: 34.7 g/dL (ref 30.0–36.0)
MCV: 88.2 fL (ref 80.0–100.0)
Platelets: 194 K/uL (ref 150–400)
RBC: 4.08 MIL/uL (ref 3.87–5.11)
RDW: 12.5 % (ref 11.5–15.5)
WBC: 8.2 K/uL (ref 4.0–10.5)
nRBC: 0 % (ref 0.0–0.2)

## 2024-06-08 LAB — PROTEIN / CREATININE RATIO, URINE
Creatinine, Urine: 126 mg/dL
Protein Creatinine Ratio: 0.2 mg/mg — ABNORMAL HIGH
Total Protein, Urine: 19 mg/dL

## 2024-06-08 LAB — COMPREHENSIVE METABOLIC PANEL WITH GFR
ALT: 11 U/L (ref 0–44)
AST: 21 U/L (ref 15–41)
Albumin: 3.3 g/dL — ABNORMAL LOW (ref 3.5–5.0)
Alkaline Phosphatase: 118 U/L (ref 38–126)
Anion gap: 12 (ref 5–15)
BUN: 9 mg/dL (ref 6–20)
CO2: 19 mmol/L — ABNORMAL LOW (ref 22–32)
Calcium: 8.8 mg/dL — ABNORMAL LOW (ref 8.9–10.3)
Chloride: 104 mmol/L (ref 98–111)
Creatinine, Ser: 0.55 mg/dL (ref 0.44–1.00)
GFR, Estimated: >60 mL/min
Glucose, Bld: 85 mg/dL (ref 70–99)
Potassium: 4 mmol/L (ref 3.5–5.1)
Sodium: 135 mmol/L (ref 135–145)
Total Bilirubin: 0.3 mg/dL (ref 0.0–1.2)
Total Protein: 6.2 g/dL — ABNORMAL LOW (ref 6.5–8.1)

## 2024-06-08 LAB — TYPE AND SCREEN
ABO/RH(D): O POS
Antibody Screen: NEGATIVE

## 2024-06-08 MED ORDER — SODIUM CHLORIDE 0.9 % IV SOLN
5.0000 10*6.[IU] | Freq: Once | INTRAVENOUS | Status: AC
  Administered 2024-06-09: 5 10*6.[IU] via INTRAVENOUS
  Filled 2024-06-08: qty 5

## 2024-06-08 MED ORDER — OXYTOCIN BOLUS FROM INFUSION
333.0000 mL | Freq: Once | INTRAVENOUS | Status: AC
  Administered 2024-06-10: 333 mL via INTRAVENOUS

## 2024-06-08 MED ORDER — ONDANSETRON HCL 4 MG/2ML IJ SOLN
4.0000 mg | Freq: Four times a day (QID) | INTRAMUSCULAR | Status: DC | PRN
  Administered 2024-06-10 (×2): 4 mg via INTRAVENOUS
  Filled 2024-06-08 (×2): qty 2

## 2024-06-08 MED ORDER — OXYCODONE-ACETAMINOPHEN 5-325 MG PO TABS: 1.0000 | ORAL_TABLET | ORAL | Status: DC | PRN

## 2024-06-08 MED ORDER — MISOPROSTOL 25 MCG QUARTER TABLET
25.0000 ug | ORAL_TABLET | Freq: Once | ORAL | Status: AC
  Administered 2024-06-08: 25 ug via VAGINAL
  Filled 2024-06-08: qty 1

## 2024-06-08 MED ORDER — LACTATED RINGERS IV SOLN: INTRAVENOUS | Status: AC

## 2024-06-08 MED ORDER — ACETAMINOPHEN 325 MG PO TABS: 650.0000 mg | ORAL_TABLET | ORAL | Status: DC | PRN

## 2024-06-08 MED ORDER — OXYTOCIN-SODIUM CHLORIDE 30-0.9 UT/500ML-% IV SOLN
2.5000 [IU]/h | INTRAVENOUS | Status: DC
  Filled 2024-06-08: qty 500

## 2024-06-08 MED ORDER — SOD CITRATE-CITRIC ACID 500-334 MG/5ML PO SOLN: 30.0000 mL | ORAL | Status: DC | PRN

## 2024-06-08 MED ORDER — LIDOCAINE HCL (PF) 1 % IJ SOLN: 30.0000 mL | INTRAMUSCULAR | Status: DC | PRN

## 2024-06-08 MED ORDER — MISOPROSTOL 50MCG HALF TABLET
50.0000 ug | ORAL_TABLET | Freq: Once | ORAL | Status: AC
  Administered 2024-06-08: 50 ug via ORAL
  Filled 2024-06-08: qty 1

## 2024-06-08 MED ORDER — TERBUTALINE SULFATE 1 MG/ML IJ SOLN: 0.2500 mg | Freq: Once | INTRAMUSCULAR | Status: DC | PRN

## 2024-06-08 MED ORDER — PENICILLIN G POT IN DEXTROSE 60000 UNIT/ML IV SOLN
3.0000 10*6.[IU] | INTRAVENOUS | Status: DC
  Administered 2024-06-09 – 2024-06-10 (×7): 3 10*6.[IU] via INTRAVENOUS
  Filled 2024-06-08 (×9): qty 50

## 2024-06-08 MED ORDER — OXYCODONE-ACETAMINOPHEN 5-325 MG PO TABS: 2.0000 | ORAL_TABLET | ORAL | Status: DC | PRN

## 2024-06-08 MED ORDER — LACTATED RINGERS IV SOLN: 500.0000 mL | INTRAVENOUS | Status: AC | PRN

## 2024-06-08 NOTE — Progress Notes (Signed)
 Patient is 37.[redacted] weeks pregnant, she is here today for blood pressure check nurse visit for follow up after MAU encounter 06/03/24 for Flu A diagnosis and treatment. Her blood pressure elevated that date. Denies headache, peripheral edema.  She endorses intermittent spotty vision if she looks around quickly, occasionally SOB this past week.    Blood pressure today:  145/86 Recheck: 149/77  Reviewed vitals with Dr. Izell, recommendation given to go to MAU for pre-e workup due to symptomatic hypertension and gestation.  Patient became tearful but understanding, verbalized that she will go to hospital.   Report given to Amber, MAU charge nurse.  MAU providers updated about incoming patient.    [place], RN 06/08/24 [phone]

## 2024-06-08 NOTE — MAU Note (Signed)
 Catherine Lawrence is a 24 y.o. at [redacted]w[redacted]d here in MAU reporting: she went into office today for a b/p check due to an elevated b/p in mau on 06/03/2024. Headache since 3 pm, pain on rt side of her neck for the last 2 days. Denies contractions, bleeding or ROM.SABRA reports positive fetal movement.   Onset of complaint: today Pain score: 6/10 headache                     8/10 right neck Vitals:   06/08/24 1733 06/08/24 1734  BP:  (!) 148/102  Pulse:  93  Resp: 15   Temp: 98.1 F (36.7 C)   SpO2: 99%

## 2024-06-08 NOTE — H&P (Cosign Needed)
 OBSTETRIC ADMISSION HISTORY AND PHYSICAL  Catherine Lawrence is 24 y.o. G1P0 with IUP at [redacted]w[redacted]d 06/27/2024, by Ultrasound presenting for IOL for gHTN. She received her prenatal care at [facility] at [redacted]w[redacted]d: normal anatomy, cephalic presentation, posterior placenta, EFW 2241g, (74%)  ROS (+) FM, ctx none (-) VB, LOF. HA, visual changes, CP, SOB, RUQ pain, peripheral edema.   Prenatal History/Complications NURSING  PROVIDER  [employer]ator, [employer] Dating by U/S at 7.3 wks  [facility] Model Mom-Baby Dyad Anatomy U/S 02/05/2024  Initiated care at  10wks                 Language  English               LAB RESULTS   Support Person   Genetics NIPS: Low risk  AFP: neg      NT/IT (FT only)        Carrier Screen Horizon: 12/11/2023;negative  Rhogam  O/Positive/-- (07/10 1132) A1C/GTT Early HgbA1C: normal Third trimester 2 hr GTT: normal  Flu Vaccine 02/06/2024      TDaP Vaccine  04/01/2024 Blood Type O/Positive/-- (07/10 1132)  RSV Vaccine Given 06/02/24 Antibody Negative (07/10 1132)  COVID Vaccine   Rubella 2.99 (07/10 1132)  Feeding Plan Both RPR Non Reactive (07/10 1132)  Contraception condoms HBsAg Negative (07/10 1132)  Circumcision undecided HIV Non Reactive (09/25 1548)  Pediatrician  MBCC HCVAb Non Reactive (07/10 1132)  Prenatal Classes        BTL Consent   Pap Post partum  BTL Pre-payment   GC/CT Initial:  neg/neg 36wks:    VBAC Consent   GBS   For PCN allergy, check sensitivities   BRx Optimized? [ ]  yes   [ ]  no      DME Rx [ ]  BP cuff [ ]  Weight Scale Waterbirth  [ ]  Class [ ]  Consent [ ]  CNM visit  PHQ9 & GAD7 [  ] new OB [  ] 28 weeks  [  ] 36 weeks Induction  [ ]  Orders Entered [ ] Foley Y/N   OB History  Gravida Para Term Preterm AB Living  1       SAB IAB Ectopic Multiple Live Births          # Outcome Date GA Lbr Len/2nd Weight Sex Type Anes PTL Lv  1 Current            Patient Active Problem List   Diagnosis Date Noted   Positive GBS test 06/05/2024    GBS (group B Streptococcus carrier), +RV culture, currently pregnant 06/05/2024   Obesity affecting pregnancy, antepartum, second trimester 12/04/2023   Supervision of low-risk first pregnancy, unspecified trimester 12/04/2023   Episodic headache 11/23/2023    Past Medical History: Past Medical History:  Diagnosis Date   Anemia    Anxiety    Depression    hx of depression    Eczema    Headache    Low calcium levels    Palpitations    has not had work up    Past Surgical History: Past Surgical History:  Procedure Laterality Date   NO PAST SURGERIES      Social History Social History   Socioeconomic History   Marital status: Single    Spouse name: Not on file   Number of children: Not on file   Years of education: Not on file   Highest education level: Not on file  Occupational History   Not on  file  Tobacco Use   Smoking status: Never   Smokeless tobacco: Never   Tobacco comments:    Quit vaping with +preg  Vaping Use   Vaping status: Former   Substances: Nicotine  Substance and Sexual Activity   Alcohol use: Not Currently    Comment: socially   Drug use: Never   Sexual activity: Not Currently    Birth control/protection: None  Other Topics Concern   Not on file  Social History Narrative   Lives with her parents    Right handed   Caffeine: rarely, before pregnancy had Red Bull 3 days a week    Social Drivers of Health   Tobacco Use: Low Risk (06/08/2024)   Patient History    Smoking Tobacco Use: Never    Smokeless Tobacco Use: Never    Passive Exposure: Not on file  Financial Resource Strain: Low Risk (11/06/2022)   Received from [facility]   Overall Financial Resource Strain (CARDIA)    Difficulty of Paying Living Expenses: Not very hard  Food Insecurity: No Food Insecurity (11/06/2022)   Received from [facility]   Epic    Within the past 12 months, you worried that your food would run out before you got the money to buy more.: Never true     Within the past 12 months, the food you bought just didn't last and you didn't have money to get more.: Never true  Transportation Needs: No Transportation Needs (11/06/2022)   Received from [facility] - Transportation    Lack of Transportation (Medical): No    Lack of Transportation (Non-Medical): No  Physical Activity: Not on File (09/20/2021)   Received from [facility]   Physical Activity    Physical Activity: 0  Stress: Not on File (09/20/2021)   Received from [facility]   Stress    Stress: 0  Social Connections: Not on File (02/11/2023)   Received from [facility]   Social Connections    Connectedness: 0  Depression (PHQ2-9): High Risk (12/11/2023)   Depression (PHQ2-9)    PHQ-2 Score: 11  Alcohol Screen: Not on file  Housing: Not on file  Utilities: Not At Risk (11/06/2022)   Received from [facility] Utilities    Threatened with loss of utilities: No  Health Literacy: Not on file    Family History: Family History  Problem Relation Age of Onset   Diabetes Father    Diabetes Mother    Hypertension Mother    Hyperthyroidism Mother    Migraines Neg Hx     Allergies: Allergies[1]  Medications Prior to Admission  Medication Sig Dispense Refill Last Dose/Taking   acetaminophen  (TYLENOL ) 500 MG tablet Take 1,000 mg by mouth every 6 (six) hours as needed.      aspirin  EC 81 MG tablet Take 1 tablet (81 mg total) by mouth daily. Swallow whole. 90 tablet 3    FEROSUL 325 (65 Fe) MG tablet Take by mouth.      mometasone  (ELOCON ) 0.1 % ointment Apply topically daily as needed (eczema). Apply thin film topically to affected area once daily as needed for eczema flareups.  Total days of use should not exceed 14 days at a time. (Patient not taking: Reported on 06/02/2024) 45 g 3    ondansetron  (ZOFRAN -ODT) 4 MG disintegrating tablet Take 1 tablet (4 mg total) by mouth every 8 (eight) hours as needed for nausea or vomiting. 15 tablet 3    oseltamivir  (TAMIFLU ) 75 MG capsule  Take 1  capsule (75 mg total) by mouth 2 (two) times daily for 5 days. 10 capsule 0    Prenatal Vit-Fe Fumarate-FA (PRENATAL MULTIVITAMIN) TABS tablet Take 1 tablet by mouth daily at 12 noon.      triamcinolone ointment (KENALOG) 0.1 % Apply topically as needed. (Patient not taking: Reported on 06/02/2024)        Review of Systems  All systems reviewed and negative except as stated in HPI  PHYSICAL EXAM Blood pressure (!) 143/91, pulse 90, temperature 98.1 F (36.7 C), temperature source Oral, resp. rate 15, height 5' 3.5 (1.613 m), weight 111.7 kg, last menstrual period 09/13/2023, SpO2 98%. General appearance: alert and cooperative Lungs: respirations nonlabored Heart: regular rate 90 Abdomen: gravid  Fetal monitoring Baseline: 140 bpm, Variability: Good {> 6 bpm), Accelerations: Reactive, and Decelerations: Absent Uterine activity none    Presentation: cephalic   Prenatal labs: ABO, Rh: O/Positive/-- (07/10 1132) Antibody: Negative (07/10 1132) Rubella: 2.99 (07/10 1132) RPR: Non Reactive (10/30 0926)  HBsAg: Negative (07/10 1132)  HIV: Non Reactive (10/30 0926)   Lab Results  Component Value Date   GBS Positive (A) 06/02/2024    Immunization History  Administered Date(s) Administered    sv, Bivalent, Protein Subunit Rsvpref,pf Marlow) 06/02/2024   Influenza, Seasonal, Injecte, Preservative Fre 02/06/2024   PFIZER(Purple Top)SARS-COV-2 Vaccination 12/21/2019, 01/11/2020   Pfizer Covid-19 Vaccine Bivalent Booster 78yrs & up 03/22/2021   Tdap 04/01/2024    Prenatal Transfer Tool  Maternal Diabetes: No Genetic Screening: Normal Maternal Ultrasounds/Referrals: Normal Fetal Ultrasounds or other Referrals:  None Maternal Substance Abuse:  No Significant Maternal Medications:  None Significant Maternal Lab Results: Group B Strep positive Number of Prenatal Visits:greater than 3 verified prenatal visits Maternal Vaccinations:RSV: Given during pregnancy >/=14 days ago,  TDap, and Flu Other Comments:  None   Results for orders placed or performed during the hospital encounter of 06/08/24 (from the past 24 hours)  Protein / creatinine ratio, urine   Collection Time: 06/08/24  5:30 PM  Result Value Ref Range   Creatinine, Urine 126 mg/dL   Total Protein, Urine 19 mg/dL   Protein Creatinine Ratio 0.2 (H) <0.2 mg/mg  CBC   Collection Time: 06/08/24  6:22 PM  Result Value Ref Range   WBC 8.2 4.0 - 10.5 K/uL   RBC 4.08 3.87 - 5.11 MIL/uL   Hemoglobin 12.5 12.0 - 15.0 g/dL   HCT 63.9 63.9 - 53.9 %   MCV 88.2 80.0 - 100.0 fL   MCH 30.6 26.0 - 34.0 pg   MCHC 34.7 30.0 - 36.0 g/dL   RDW 87.4 88.4 - 84.4 %   Platelets 194 150 - 400 K/uL   nRBC 0.0 0.0 - 0.2 %  Comprehensive metabolic panel with GFR   Collection Time: 06/08/24  6:22 PM  Result Value Ref Range   Sodium 135 135 - 145 mmol/L   Potassium 4.0 3.5 - 5.1 mmol/L   Chloride 104 98 - 111 mmol/L   CO2 19 (L) 22 - 32 mmol/L   Glucose, Bld 85 70 - 99 mg/dL   BUN 9 6 - 20 mg/dL   Creatinine, Ser 9.44 0.44 - 1.00 mg/dL   Calcium 8.8 (L) 8.9 - 10.3 mg/dL   Total Protein 6.2 (L) 6.5 - 8.1 g/dL   Albumin 3.3 (L) 3.5 - 5.0 g/dL   AST 21 15 - 41 U/L   ALT 11 0 - 44 U/L   Alkaline Phosphatase 118 38 - 126 U/L  Total Bilirubin 0.3 0.0 - 1.2 mg/dL   GFR, Estimated >39 >39 mL/min   Anion gap 12 5 - 15    Patient Active Problem List   Diagnosis Date Noted   Positive GBS test 06/05/2024   GBS (group B Streptococcus carrier), +RV culture, currently pregnant 06/05/2024   Obesity affecting pregnancy, antepartum, second trimester 12/04/2023   Supervision of low-risk first pregnancy, unspecified trimester 12/04/2023   Episodic headache 11/23/2023    ASSESSMENT & PLAN Margaree Sandhu is 24 y.o. G1P0 with IUP at [redacted]w[redacted]d 06/27/2024, by Ultrasound admitted for IOL for gHTN.  Sono at [redacted]w[redacted]d: normal anatomy, cephalic presentation, posterior placenta, EFW 2241g, (74%)  #Labor: Dual cytotec . Plan FB at  next check in 4 hours. #Pain: Per patient preference, encourage ambulation #FWB: Cat 1  #gHTN: labs WNL  #GBS status:  positive #Feeding: Breastmilk  and Formula #Reproductive Life planning: Condoms #Circ:  undecided   Conard Me, SNM, RNC-OB Student Nurse Midwife 06/08/2024 9:26 PM    Midwife Attestation:  I personally saw and evaluated the patient, performing the key elements of the service. I developed and verified the management plan that is described in the resident's/student's note, and I agree with the content with my edits above. VSS, HRR&R, Resp unlabored, Legs neg.    Olam Boards, CNM 2:36 AM       [1]  Allergies Allergen Reactions   Aleve Arthritis Pain [Diclofenac Sodium] Rash

## 2024-06-09 DIAGNOSIS — Z0289 Encounter for other administrative examinations: Secondary | ICD-10-CM

## 2024-06-09 LAB — HIV ANTIBODY (ROUTINE TESTING W REFLEX): HIV Screen 4th Generation wRfx: NONREACTIVE

## 2024-06-09 MED ORDER — OXYTOCIN-SODIUM CHLORIDE 30-0.9 UT/500ML-% IV SOLN
1.0000 m[IU]/min | INTRAVENOUS | Status: DC
  Administered 2024-06-09: 2 m[IU]/min via INTRAVENOUS
  Filled 2024-06-09: qty 500

## 2024-06-09 MED ORDER — TERBUTALINE SULFATE 1 MG/ML IJ SOLN: 0.2500 mg | Freq: Once | INTRAMUSCULAR | Status: DC | PRN

## 2024-06-09 MED ORDER — MISOPROSTOL 25 MCG QUARTER TABLET
25.0000 ug | ORAL_TABLET | ORAL | Status: DC
  Administered 2024-06-09 (×3): 25 ug via VAGINAL
  Filled 2024-06-09 (×5): qty 1

## 2024-06-09 NOTE — Progress Notes (Signed)
 24 yo g1 @ 37+3 here for iol for ghtn. Ghtn mild. Cat 1 tracing. S/p multiple doses cytotec . 1/60/-2. Cook catheter balloon (60 ml) placed without complication, additional dose vaginal cytotec  also placed. Will continue with q 4 vaginal cytotec .

## 2024-06-09 NOTE — Progress Notes (Signed)
 Labor Progress Note Sidnee Gambrill is a 24 y.o. G1P0 at [redacted]w[redacted]d presented for IOL for gHTN   S:  Resting in chair beside bed.  O:  BP (!) 149/95   Pulse 87   Temp 98.3 F (36.8 C) (Oral)   Resp 17   Ht 5' 3.5 (1.613 m)   Wt 111.7 kg   LMP 09/13/2023 (Exact Date)   SpO2 99%   BMI 42.93 kg/m   EFM: baseline 120 bpm/ moderate variability/ 15x15 accels/ no decels  Toco/IUPC: difficult to trace while patient is sitting  SVE: Dilation: 4 Effacement (%): 60 Cervical Position: Posterior Station: -3 Presentation: Vertex (confirmed by bedside US ) Exam by:: Glennon Chihuahua, RN Pitocin : 4 mu/min  A/P: 24 y.o. G1P0 [redacted]w[redacted]d  1. Labor: Cervical exam deferred. Continue to titrate pitocin  as necessary. 2. FWB: Cat 1 3. Pain: per patient request 4. GBS pos > PCN 5. gHTN: labs WNL    Anticipate SVB.  Conard VEAR Me, Student-MidWife 9:46 PM

## 2024-06-10 ENCOUNTER — Inpatient Hospital Stay (HOSPITAL_COMMUNITY): Admitting: Anesthesiology

## 2024-06-10 ENCOUNTER — Encounter (HOSPITAL_COMMUNITY): Payer: Self-pay | Admitting: Obstetrics and Gynecology

## 2024-06-10 DIAGNOSIS — O1410 Severe pre-eclampsia, unspecified trimester: Secondary | ICD-10-CM | POA: Diagnosis not present

## 2024-06-10 DIAGNOSIS — O99214 Obesity complicating childbirth: Secondary | ICD-10-CM

## 2024-06-10 DIAGNOSIS — O99820 Streptococcus B carrier state complicating pregnancy: Secondary | ICD-10-CM

## 2024-06-10 DIAGNOSIS — O1414 Severe pre-eclampsia complicating childbirth: Secondary | ICD-10-CM

## 2024-06-10 DIAGNOSIS — Z3A37 37 weeks gestation of pregnancy: Secondary | ICD-10-CM

## 2024-06-10 LAB — CBC
HCT: 37.7 % (ref 36.0–46.0)
HCT: 36.9 % (ref 36.0–46.0)
Hemoglobin: 13.2 g/dL (ref 12.0–15.0)
Hemoglobin: 12.8 g/dL (ref 12.0–15.0)
MCH: 30.9 pg (ref 26.0–34.0)
MCH: 30.8 pg (ref 26.0–34.0)
MCHC: 35 g/dL (ref 30.0–36.0)
MCHC: 34.7 g/dL (ref 30.0–36.0)
MCV: 88.3 fL (ref 80.0–100.0)
MCV: 88.7 fL (ref 80.0–100.0)
Platelets: 186 K/uL (ref 150–400)
Platelets: 194 K/uL (ref 150–400)
RBC: 4.27 MIL/uL (ref 3.87–5.11)
RBC: 4.16 MIL/uL (ref 3.87–5.11)
RDW: 12.4 % (ref 11.5–15.5)
RDW: 12.6 % (ref 11.5–15.5)
WBC: 12.3 K/uL — ABNORMAL HIGH (ref 4.0–10.5)
WBC: 16.5 K/uL — ABNORMAL HIGH (ref 4.0–10.5)
nRBC: 0 % (ref 0.0–0.2)
nRBC: 0 % (ref 0.0–0.2)

## 2024-06-10 LAB — COMPREHENSIVE METABOLIC PANEL WITH GFR
ALT: 15 U/L (ref 0–44)
ALT: 11 U/L (ref 0–44)
AST: 26 U/L (ref 15–41)
AST: 29 U/L (ref 15–41)
Albumin: 3.3 g/dL — ABNORMAL LOW (ref 3.5–5.0)
Albumin: 3.1 g/dL — ABNORMAL LOW (ref 3.5–5.0)
Alkaline Phosphatase: 129 U/L — ABNORMAL HIGH (ref 38–126)
Alkaline Phosphatase: 140 U/L — ABNORMAL HIGH (ref 38–126)
Anion gap: 13 (ref 5–15)
Anion gap: 15 (ref 5–15)
BUN: 5 mg/dL — ABNORMAL LOW (ref 6–20)
BUN: 8 mg/dL (ref 6–20)
CO2: 19 mmol/L — ABNORMAL LOW (ref 22–32)
CO2: 19 mmol/L — ABNORMAL LOW (ref 22–32)
Calcium: 9 mg/dL (ref 8.9–10.3)
Calcium: 8.6 mg/dL — ABNORMAL LOW (ref 8.9–10.3)
Chloride: 104 mmol/L (ref 98–111)
Chloride: 104 mmol/L (ref 98–111)
Creatinine, Ser: 0.56 mg/dL (ref 0.44–1.00)
Creatinine, Ser: 1.31 mg/dL — ABNORMAL HIGH (ref 0.44–1.00)
GFR, Estimated: >60 mL/min
GFR, Estimated: 59 mL/min — ABNORMAL LOW
Glucose, Bld: 81 mg/dL (ref 70–99)
Glucose, Bld: 97 mg/dL (ref 70–99)
Potassium: 3.9 mmol/L (ref 3.5–5.1)
Potassium: 4 mmol/L (ref 3.5–5.1)
Sodium: 137 mmol/L (ref 135–145)
Sodium: 138 mmol/L (ref 135–145)
Total Bilirubin: 0.6 mg/dL (ref 0.0–1.2)
Total Bilirubin: 0.9 mg/dL (ref 0.0–1.2)
Total Protein: 6.4 g/dL — ABNORMAL LOW (ref 6.5–8.1)
Total Protein: 6.2 g/dL — ABNORMAL LOW (ref 6.5–8.1)

## 2024-06-10 LAB — SYPHILIS: RPR W/REFLEX TO RPR TITER AND TREPONEMAL ANTIBODIES, TRADITIONAL SCREENING AND DIAGNOSIS ALGORITHM: RPR Ser Ql: NONREACTIVE

## 2024-06-10 MED ORDER — FENTANYL-BUPIVACAINE-NACL 0.5-0.125-0.9 MG/250ML-% EP SOLN
12.0000 mL/h | EPIDURAL | Status: DC | PRN
  Administered 2024-06-10: 12 mL/h via EPIDURAL
  Filled 2024-06-10: qty 250

## 2024-06-10 MED ORDER — LABETALOL HCL 5 MG/ML IV SOLN
80.0000 mg | INTRAVENOUS | Status: DC | PRN
  Administered 2024-06-10: 80 mg via INTRAVENOUS
  Filled 2024-06-10: qty 16

## 2024-06-10 MED ORDER — LABETALOL HCL 5 MG/ML IV SOLN
20.0000 mg | INTRAVENOUS | Status: DC | PRN
  Administered 2024-06-10: 20 mg via INTRAVENOUS
  Filled 2024-06-10 (×2): qty 4

## 2024-06-10 MED ORDER — DIPHENHYDRAMINE HCL 50 MG/ML IJ SOLN: 12.5000 mg | INTRAMUSCULAR | Status: DC | PRN

## 2024-06-10 MED ORDER — PHENYLEPHRINE 80 MCG/ML (10ML) SYRINGE FOR IV PUSH (FOR BLOOD PRESSURE SUPPORT): 80.0000 ug | PREFILLED_SYRINGE | INTRAVENOUS | Status: DC | PRN

## 2024-06-10 MED ORDER — EPHEDRINE 5 MG/ML INJ: 10.0000 mg | INTRAVENOUS | Status: DC | PRN

## 2024-06-10 MED ORDER — LABETALOL HCL 5 MG/ML IV SOLN
40.0000 mg | INTRAVENOUS | Status: DC | PRN
  Administered 2024-06-10: 40 mg via INTRAVENOUS
  Filled 2024-06-10: qty 8

## 2024-06-10 MED ORDER — HYDRALAZINE HCL 20 MG/ML IJ SOLN: 10.0000 mg | INTRAMUSCULAR | Status: DC | PRN

## 2024-06-10 MED ORDER — LIDOCAINE HCL (PF) 1 % IJ SOLN
INTRAMUSCULAR | Status: DC | PRN
  Administered 2024-06-10: 10 mL via EPIDURAL

## 2024-06-10 MED ORDER — LACTATED RINGERS IV SOLN: 500.0000 mL | Freq: Once | INTRAVENOUS | Status: DC

## 2024-06-10 NOTE — Plan of Care (Signed)
" °  Problem: Education: Goal: Knowledge of Childbirth will improve 06/10/2024 2357 by Elinda Pamula SAUNDERS, RN Outcome: Completed/Met 06/10/2024 2022 by Elinda Pamula SAUNDERS, RN Outcome: Progressing Goal: Ability to make informed decisions regarding treatment and plan of care will improve 06/10/2024 2357 by Elinda Pamula SAUNDERS, RN Outcome: Completed/Met 06/10/2024 2022 by Elinda Pamula SAUNDERS, RN Outcome: Progressing Goal: Ability to state and carry out methods to decrease the pain will improve 06/10/2024 2357 by Elinda Pamula SAUNDERS, RN Outcome: Completed/Met 06/10/2024 2022 by Elinda Pamula SAUNDERS, RN Outcome: Progressing Goal: Individualized Educational Video(s) 06/10/2024 2357 by Elinda Pamula SAUNDERS, RN Outcome: Completed/Met 06/10/2024 2022 by Elinda Pamula SAUNDERS, RN Outcome: Progressing   Problem: Coping: Goal: Ability to verbalize concerns and feelings about labor and delivery will improve 06/10/2024 2357 by Elinda Pamula SAUNDERS, RN Outcome: Completed/Met 06/10/2024 2022 by Elinda Pamula SAUNDERS, RN Outcome: Progressing   Problem: Life Cycle: Goal: Ability to make normal progression through stages of labor will improve 06/10/2024 2357 by Elinda Pamula SAUNDERS, RN Outcome: Completed/Met 06/10/2024 2022 by Elinda Pamula SAUNDERS, RN Outcome: Progressing Goal: Ability to effectively push during vaginal delivery will improve 06/10/2024 2357 by Elinda Pamula SAUNDERS, RN Outcome: Completed/Met 06/10/2024 2022 by Elinda Pamula SAUNDERS, RN Outcome: Progressing   Problem: Role Relationship: Goal: Will demonstrate positive interactions with the child 06/10/2024 2357 by Elinda Pamula SAUNDERS, RN Outcome: Completed/Met 06/10/2024 2022 by Elinda Pamula SAUNDERS, RN Outcome: Progressing   Problem: Safety: Goal: Risk of complications during labor and delivery will decrease 06/10/2024 2357 by Elinda Pamula SAUNDERS, RN Outcome: Completed/Met 06/10/2024 2022 by Elinda Pamula SAUNDERS, RN Outcome: Progressing   Problem: Pain Management: Goal: Relief or control of pain from uterine  contractions will improve 06/10/2024 2357 by Elinda Pamula SAUNDERS, RN Outcome: Completed/Met 06/10/2024 2022 by Elinda Pamula SAUNDERS, RN Outcome: Progressing   Problem: Education: Goal: Knowledge of disease or condition will improve 06/10/2024 2357 by Elinda Pamula SAUNDERS, RN Outcome: Completed/Met 06/10/2024 2022 by Elinda Pamula SAUNDERS, RN Outcome: Progressing Goal: Knowledge of the prescribed therapeutic regimen will improve 06/10/2024 2357 by Elinda Pamula SAUNDERS, RN Outcome: Completed/Met 06/10/2024 2022 by Elinda Pamula SAUNDERS, RN Outcome: Progressing   Problem: Fluid Volume: Goal: Peripheral tissue perfusion will improve 06/10/2024 2357 by Elinda Pamula SAUNDERS, RN Outcome: Completed/Met 06/10/2024 2022 by Elinda Pamula SAUNDERS, RN Outcome: Progressing   Problem: Clinical Measurements: Goal: Complications related to disease process, condition or treatment will be avoided or minimized 06/10/2024 2357 by Elinda Pamula SAUNDERS, RN Outcome: Completed/Met 06/10/2024 2022 by Elinda Pamula SAUNDERS, RN Outcome: Progressing   "

## 2024-06-10 NOTE — Anesthesia Preprocedure Evaluation (Addendum)
"                                    Anesthesia Evaluation  Patient identified by MRN, date of birth, ID band Patient awake    Reviewed: Allergy & Precautions, NPO status , Patient's Chart, lab work & pertinent test results  Airway Mallampati: III  TM Distance: >3 FB Neck ROM: Full    Dental no notable dental hx.    Pulmonary neg pulmonary ROS   Pulmonary exam normal breath sounds clear to auscultation       Cardiovascular hypertension (gHTN), Normal cardiovascular exam Rhythm:Regular Rate:Normal     Neuro/Psych  Headaches PSYCHIATRIC DISORDERS Anxiety Depression       GI/Hepatic negative GI ROS, Neg liver ROS,,,  Endo/Other    Class 3 obesity (BMI 43)  Renal/GU negative Renal ROS  negative genitourinary   Musculoskeletal negative musculoskeletal ROS (+)    Abdominal   Peds  Hematology negative hematology ROS (+)   Anesthesia Other Findings IOL for gHTN  Reproductive/Obstetrics (+) Pregnancy                              Anesthesia Physical Anesthesia Plan  ASA: 3  Anesthesia Plan: Epidural   Post-op Pain Management:    Induction:   PONV Risk Score and Plan: Treatment may vary due to age or medical condition  Airway Management Planned: Natural Airway  Additional Equipment:   Intra-op Plan:   Post-operative Plan:   Informed Consent: I have reviewed the patients History and Physical, chart, labs and discussed the procedure including the risks, benefits and alternatives for the proposed anesthesia with the patient or authorized representative who has indicated his/her understanding and acceptance.       Plan Discussed with: Anesthesiologist  Anesthesia Plan Comments: (Patient identified. Risks, benefits, options discussed with patient including but not limited to bleeding, infection, nerve damage, paralysis, failed block, incomplete pain control, headache, blood pressure changes, nausea, vomiting, reactions to  medication, itching, and post partum back pain. Confirmed with bedside nurse the patient's most recent platelet count. Confirmed with the patient that they are not taking any anticoagulation, have any bleeding history or any family history of bleeding disorders. Patient expressed understanding and wishes to proceed. All questions were answered. )        Anesthesia Quick Evaluation  "

## 2024-06-10 NOTE — Progress Notes (Signed)
 Labor Progress Note Catherine Lawrence is a 24 y.o. G1P0 at [redacted]w[redacted]d presented for IOL for gHTN  S:  Asleep in bed  O:  BP (!) 145/91   Pulse 80   Temp 97.6 F (36.4 C) (Oral)   Resp 16   Ht 5' 3.5 (1.613 m)   Wt 111.7 kg   LMP 09/13/2023 (Exact Date)   SpO2 99%   BMI 42.93 kg/m   EFM: baseline 120 bpm/ moderate variability/ 15x15 accels/ no decels  Toco/IUPC: q 1-33mins  SVE: Dilation: 4 Effacement (%): 60 Cervical Position: Posterior Station: -3 Presentation: Vertex (confirmed by bedside US ) Exam by:: Glennon Chihuahua, RN Pitocin : 10 mu/min  A/P: 24 y.o. G1P0 [redacted]w[redacted]d  1. Labor: Cervical exam unchanged. Continue to titrate pitocin . 2. FWB: Cat 1 3. Pain: per patient request 4. GBS pos > PCN 5. gHTN: labs WNL   Anticipate SVB.  Eshaan Titzer H Zyier Dykema, Student-MidWife 1:59 AM

## 2024-06-10 NOTE — Progress Notes (Signed)
 24 yo g1 @ 37+4 here for iol for ghtn. BPs moderate, asymptomatic. Cat 1 tracing. Now s/p cycotec and balloon. 5/70/-3, was 4 last check about 9 hours ago. Is on pitocin . Risks of arom discussed including risk of cord prolapse and NRFHTs. Patient consents to arom, performed with scant mec. Will continue pitocin , iupc at next check if no change. Checking preE labs.

## 2024-06-10 NOTE — Plan of Care (Signed)
  Problem: Education: Goal: Knowledge of General Education information will improve Description: Including pain rating scale, medication(s)/side effects and non-pharmacologic comfort measures Outcome: Progressing   Problem: Health Behavior/Discharge Planning: Goal: Ability to manage health-related needs will improve Outcome: Progressing   Problem: Clinical Measurements: Goal: Ability to maintain clinical measurements within normal limits will improve Outcome: Progressing Goal: Will remain free from infection Outcome: Progressing Goal: Diagnostic test results will improve Outcome: Progressing Goal: Respiratory complications will improve Outcome: Progressing Goal: Cardiovascular complication will be avoided Outcome: Progressing   Problem: Activity: Goal: Risk for activity intolerance will decrease Outcome: Progressing   Problem: Nutrition: Goal: Adequate nutrition will be maintained Outcome: Progressing   Problem: Coping: Goal: Level of anxiety will decrease Outcome: Progressing   Problem: Elimination: Goal: Will not experience complications related to bowel motility Outcome: Progressing Goal: Will not experience complications related to urinary retention Outcome: Progressing   Problem: Pain Managment: Goal: General experience of comfort will improve and/or be controlled Outcome: Progressing   Problem: Safety: Goal: Ability to remain free from injury will improve Outcome: Progressing   Problem: Skin Integrity: Goal: Risk for impaired skin integrity will decrease Outcome: Progressing   Problem: Education: Goal: Knowledge of Childbirth will improve Outcome: Progressing Goal: Ability to make informed decisions regarding treatment and plan of care will improve Outcome: Progressing Goal: Ability to state and carry out methods to decrease the pain will improve Outcome: Progressing Goal: Individualized Educational Video(s) Outcome: Progressing   Problem:  Coping: Goal: Ability to verbalize concerns and feelings about labor and delivery will improve Outcome: Progressing   Problem: Life Cycle: Goal: Ability to make normal progression through stages of labor will improve Outcome: Progressing Goal: Ability to effectively push during vaginal delivery will improve Outcome: Progressing   Problem: Role Relationship: Goal: Will demonstrate positive interactions with the child Outcome: Progressing   Problem: Safety: Goal: Risk of complications during labor and delivery will decrease Outcome: Progressing   Problem: Pain Management: Goal: Relief or control of pain from uterine contractions will improve Outcome: Progressing   Problem: Education: Goal: Knowledge of disease or condition will improve Outcome: Progressing Goal: Knowledge of the prescribed therapeutic regimen will improve Outcome: Progressing   Problem: Fluid Volume: Goal: Peripheral tissue perfusion will improve Outcome: Progressing   Problem: Clinical Measurements: Goal: Complications related to disease process, condition or treatment will be avoided or minimized Outcome: Progressing

## 2024-06-10 NOTE — Anesthesia Procedure Notes (Signed)
 Epidural Patient location during procedure: OB Start time: 06/10/2024 9:05 AM End time: 06/10/2024 9:15 AM  Staffing Anesthesiologist: Niels Marien CROME, MD Performed: anesthesiologist   Preanesthetic Checklist Completed: patient identified, IV checked, risks and benefits discussed, monitors and equipment checked, pre-op evaluation and timeout performed  Epidural Patient position: sitting Prep: DuraPrep and site prepped and draped Patient monitoring: continuous pulse ox, blood pressure, heart rate and cardiac monitor Approach: midline Location: L3-L4 Injection technique: LOR air  Needle:  Needle type: Tuohy  Needle gauge: 17 G Needle length: 9 cm Needle insertion depth: 6 cm Catheter type: closed end flexible Catheter size: 19 Gauge Catheter at skin depth: 11 cm Test dose: negative  Assessment Sensory level: T8 Events: blood not aspirated, no cerebrospinal fluid, injection not painful, no injection resistance, no paresthesia and negative IV test  Additional Notes Patient identified. Risks/Benefits/Options discussed with patient including but not limited to bleeding, infection, nerve damage, paralysis, failed block, incomplete pain control, headache, blood pressure changes, nausea, vomiting, reactions to medication both or allergic, itching and postpartum back pain. Confirmed with bedside nurse the patient's most recent platelet count. Confirmed with patient that they are not currently taking any anticoagulation, have any bleeding history or any family history of bleeding disorders. Patient expressed understanding and wished to proceed. All questions were answered. Sterile technique was used throughout the entire procedure. Please see nursing notes for vital signs. Test dose was given through epidural catheter and negative prior to continuing to dose epidural or start infusion. Warning signs of high block given to the patient including shortness of breath, tingling/numbness in hands,  complete motor block, or any concerning symptoms with instructions to call for help. Patient was given instructions on fall risk and not to get out of bed. All questions and concerns addressed with instructions to call with any issues or inadequate analgesia.  Reason for block:procedure for pain

## 2024-06-10 NOTE — Discharge Summary (Signed)
 "    Postpartum Discharge Summary  Date of Service updated***     Patient Name: Catherine Lawrence DOB: [birth year] MRN: [id number]  Date of admission: 06/08/2024 Delivery date:06/10/2024 Delivering provider: NEWTON MERING Date of discharge: 06/10/2024  Admitting diagnosis: Gestational hypertension [O13.9] Intrauterine pregnancy: [redacted]w[redacted]d     Secondary diagnosis:  Principal Problem:   Gestational hypertension Active Problems:   GBS (group B Streptococcus carrier), +RV culture, currently pregnant   Vaginal delivery   Preeclampsia, severe  Additional problems: ***    Discharge diagnosis: Term Pregnancy Delivered and Gestational Hypertension                                              Post partum procedures:{Postpartum procedures:23558} Augmentation: AROM, Pitocin , Cytotec , and IP Foley Complications: None  Hospital course: Induction of Labor With Vaginal Delivery   24 y.o. yo G1P0 at [redacted]w[redacted]d was admitted to the hospital 06/08/2024 for induction of labor.  Indication for induction: Gestational hypertension.  Patient had an labor course complicated by nothing.  Had a few severe range BPs during labor, creatinine up to 1.31>Mag started postpartum Membrane Rupture Time/Date: 9:59 AM,06/10/2024  Delivery Method:Vaginal, Spontaneous Operative Delivery:N/A Episiotomy: None Lacerations:  2nd degree;Perineal Details of delivery can be found in separate delivery note.  Patient had a postpartum course complicated by***. Patient is discharged home 06/10/2024.  Newborn Data: Birth date:06/10/2024 Birth time:9:06 PM Gender:Female Living status:Living Apgars:9 ,  Weight:   Magnesium  Sulfate received: {Mag received:30440022} BMZ received: No Rhophylac:N/A MMR:N/A T-DaP:Given prenatally Flu: Yes RSV Vaccine received: Yes Transfusion:{Transfusion received:30440034}  Immunizations received: Immunization History  Administered Date(s) Administered    sv, Bivalent, Protein Subunit  Rsvpref,pf [place]) 06/02/2024   Influenza, Seasonal, Injecte, Preservative Fre 02/06/2024   PFIZER(Purple Top)SARS-COV-2 Vaccination 12/21/2019, 01/11/2020   Pfizer Covid-19 Vaccine Bivalent Booster 108yrs & up 03/22/2021   Tdap 04/01/2024    Physical exam  Vitals:   06/10/24 1932 06/10/24 2003 06/10/24 2118 06/10/24 2132  BP: (!) 158/85 (!) 147/88 125/75 131/67  Pulse: 94 97 (!) 106 (!) 143  Resp:  20    Temp:  100 F (37.8 C)    TempSrc:  Oral    SpO2:  100%    Weight:      Height:       General: {Exam; general:21111117} Lochia: {Desc; appropriate/inappropriate:30686::appropriate} Uterine Fundus: {Desc; firm/soft:30687} Incision: {Exam; incision:21111123} DVT Evaluation: {Exam; dvt:2111122} Labs: Lab Results  Component Value Date   WBC 16.5 (H) 06/10/2024   HGB 12.8 06/10/2024   HCT 36.9 06/10/2024   MCV 88.7 06/10/2024   PLT 194 06/10/2024      Latest Ref Rng & Units 06/10/2024    7:35 PM  CMP  Glucose 70 - 99 mg/dL 97   BUN 6 - 20 mg/dL 8   Creatinine 9.55 - 8.99 mg/dL 8.68   Sodium 864 - 854 mmol/L 138   Potassium 3.5 - 5.1 mmol/L 4.0   Chloride 98 - 111 mmol/L 104   CO2 22 - 32 mmol/L 19   Calcium 8.9 - 10.3 mg/dL 8.6   Total Protein 6.5 - 8.1 g/dL 6.2   Total Bilirubin 0.0 - 1.2 mg/dL 0.9   Alkaline Phos 38 - 126 U/L 140   AST 15 - 41 U/L 29   ALT 0 - 44 U/L 11    Edinburgh Score:     No data to display  No data recorded  After visit meds:  Allergies as of 06/10/2024       Reactions   Aleve Arthritis Pain [diclofenac Sodium] Rash     Med Rec must be completed prior to using this [facility]***        Discharge home in stable condition Infant Feeding: {Baby feeding:23562} Infant Disposition:{CHL IP OB HOME WITH FNUYZM:76418} Discharge instruction: per After Visit Summary and Postpartum booklet. Activity: Advance as tolerated. Pelvic rest for 6 weeks.  Diet: {OB ipzu:78888878} Future Appointments: Future Appointments  Date  Time Provider Department Center  06/11/2024  8:55 AM [name], MD [facility]  06/18/2024  8:55 AM [name], MD [facility]  06/18/2024  9:15 AM [facility] PROVIDER 1 [facility]  06/18/2024  9:30 AM [facility] US1 [facility]  06/25/2024  8:55 AM [name], MD [facility]  07/02/2024  8:55 AM Ilean, Norleen GAILS, MD [facility]   Follow up Visit:   Please schedule this patient for a In person postpartum visit in 4 weeks with the following provider: [facility]. Additional Postpartum F/U:BP check 1 week  Low risk pregnancy complicated by: HTN Delivery mode:  Vaginal, Spontaneous Anticipated Birth Control:  Condoms   06/10/2024 Cathlean Ely, CNM    "

## 2024-06-11 ENCOUNTER — Other Ambulatory Visit: Payer: Self-pay

## 2024-06-11 ENCOUNTER — Encounter

## 2024-06-11 ENCOUNTER — Encounter: Admitting: Family Medicine

## 2024-06-11 LAB — COMPREHENSIVE METABOLIC PANEL WITH GFR
ALT: 11 U/L (ref 0–44)
AST: 26 U/L (ref 15–41)
Albumin: 2.7 g/dL — ABNORMAL LOW (ref 3.5–5.0)
Alkaline Phosphatase: 99 U/L (ref 38–126)
Anion gap: 13 (ref 5–15)
BUN: 9 mg/dL (ref 6–20)
CO2: 20 mmol/L — ABNORMAL LOW (ref 22–32)
Calcium: 8.5 mg/dL — ABNORMAL LOW (ref 8.9–10.3)
Chloride: 104 mmol/L (ref 98–111)
Creatinine, Ser: 1.04 mg/dL — ABNORMAL HIGH (ref 0.44–1.00)
GFR, Estimated: >60 mL/min
Glucose, Bld: 101 mg/dL — ABNORMAL HIGH (ref 70–99)
Potassium: 3.6 mmol/L (ref 3.5–5.1)
Sodium: 136 mmol/L (ref 135–145)
Total Bilirubin: 0.7 mg/dL (ref 0.0–1.2)
Total Protein: 5.4 g/dL — ABNORMAL LOW (ref 6.5–8.1)

## 2024-06-11 LAB — CBC
HCT: 28.9 % — ABNORMAL LOW (ref 36.0–46.0)
Hemoglobin: 10.1 g/dL — ABNORMAL LOW (ref 12.0–15.0)
MCH: 31.2 pg (ref 26.0–34.0)
MCHC: 34.9 g/dL (ref 30.0–36.0)
MCV: 89.2 fL (ref 80.0–100.0)
Platelets: 170 K/uL (ref 150–400)
RBC: 3.24 MIL/uL — ABNORMAL LOW (ref 3.87–5.11)
RDW: 12.6 % (ref 11.5–15.5)
WBC: 15.8 K/uL — ABNORMAL HIGH (ref 4.0–10.5)
nRBC: 0 % (ref 0.0–0.2)

## 2024-06-11 LAB — MAGNESIUM: Magnesium: 4.5 mg/dL — ABNORMAL HIGH (ref 1.7–2.4)

## 2024-06-11 MED ORDER — LABETALOL HCL 5 MG/ML IV SOLN: 20.0000 mg | INTRAVENOUS | Status: DC | PRN

## 2024-06-11 MED ORDER — ONDANSETRON HCL 4 MG PO TABS: 4.0000 mg | ORAL_TABLET | ORAL | Status: DC | PRN

## 2024-06-11 MED ORDER — WITCH HAZEL-GLYCERIN EX PADS: 1.0000 | MEDICATED_PAD | CUTANEOUS | Status: DC | PRN

## 2024-06-11 MED ORDER — POTASSIUM CHLORIDE CRYS ER 20 MEQ PO TBCR
20.0000 meq | EXTENDED_RELEASE_TABLET | Freq: Every day | ORAL | Status: DC
  Administered 2024-06-11 – 2024-06-12 (×2): 20 meq via ORAL
  Filled 2024-06-11 (×2): qty 1

## 2024-06-11 MED ORDER — LABETALOL HCL 5 MG/ML IV SOLN: 40.0000 mg | INTRAVENOUS | Status: DC | PRN

## 2024-06-11 MED ORDER — SIMETHICONE 80 MG PO CHEW: 80.0000 mg | CHEWABLE_TABLET | ORAL | Status: DC | PRN

## 2024-06-11 MED ORDER — NIFEDIPINE ER OSMOTIC RELEASE 30 MG PO TB24
30.0000 mg | ORAL_TABLET | Freq: Every day | ORAL | Status: DC
  Administered 2024-06-11 – 2024-06-12 (×2): 30 mg via ORAL
  Filled 2024-06-11 (×2): qty 1

## 2024-06-11 MED ORDER — MAGNESIUM SULFATE BOLUS VIA INFUSION
4.0000 g | Freq: Once | INTRAVENOUS | Status: AC
  Administered 2024-06-11: 4 g via INTRAVENOUS
  Filled 2024-06-11: qty 1000

## 2024-06-11 MED ORDER — PRENATAL MULTIVITAMIN CH
1.0000 | ORAL_TABLET | Freq: Every day | ORAL | Status: DC
  Administered 2024-06-11 – 2024-06-12 (×2): 1 via ORAL
  Filled 2024-06-11 (×2): qty 1

## 2024-06-11 MED ORDER — LACTATED RINGERS IV SOLN: INTRAVENOUS | Status: AC

## 2024-06-11 MED ORDER — DIBUCAINE (PERIANAL) 1 % EX OINT: 1.0000 | TOPICAL_OINTMENT | CUTANEOUS | Status: DC | PRN

## 2024-06-11 MED ORDER — DIPHENHYDRAMINE HCL 25 MG PO CAPS: 25.0000 mg | ORAL_CAPSULE | Freq: Four times a day (QID) | ORAL | Status: DC | PRN

## 2024-06-11 MED ORDER — FUROSEMIDE 20 MG PO TABS
20.0000 mg | ORAL_TABLET | Freq: Every day | ORAL | Status: DC
  Administered 2024-06-11 – 2024-06-12 (×2): 20 mg via ORAL
  Filled 2024-06-11 (×2): qty 1

## 2024-06-11 MED ORDER — COCONUT OIL OIL: 1.0000 | TOPICAL_OIL | Status: DC | PRN

## 2024-06-11 MED ORDER — OXYCODONE HCL 5 MG PO TABS: 10.0000 mg | ORAL_TABLET | ORAL | Status: DC | PRN

## 2024-06-11 MED ORDER — LABETALOL HCL 5 MG/ML IV SOLN: 80.0000 mg | INTRAVENOUS | Status: DC | PRN

## 2024-06-11 MED ORDER — ONDANSETRON HCL 4 MG/2ML IJ SOLN: 4.0000 mg | INTRAMUSCULAR | Status: DC | PRN

## 2024-06-11 MED ORDER — BENZOCAINE-MENTHOL 20-0.5 % EX AERO
1.0000 | INHALATION_SPRAY | CUTANEOUS | Status: DC | PRN
  Filled 2024-06-11: qty 56

## 2024-06-11 MED ORDER — ACETAMINOPHEN 325 MG PO TABS
650.0000 mg | ORAL_TABLET | ORAL | Status: DC | PRN
  Administered 2024-06-11 – 2024-06-12 (×5): 650 mg via ORAL
  Filled 2024-06-11 (×5): qty 2

## 2024-06-11 MED ORDER — HYDRALAZINE HCL 20 MG/ML IJ SOLN: 10.0000 mg | INTRAMUSCULAR | Status: DC | PRN

## 2024-06-11 MED ORDER — SENNOSIDES-DOCUSATE SODIUM 8.6-50 MG PO TABS
2.0000 | ORAL_TABLET | Freq: Every day | ORAL | Status: DC
  Administered 2024-06-11 – 2024-06-12 (×2): 2 via ORAL
  Filled 2024-06-11 (×2): qty 2

## 2024-06-11 MED ORDER — ZOLPIDEM TARTRATE 5 MG PO TABS: 5.0000 mg | ORAL_TABLET | Freq: Every evening | ORAL | Status: DC | PRN

## 2024-06-11 MED ORDER — TETANUS-DIPHTH-ACELL PERTUSSIS 5-2-15.5 LF-MCG/0.5 IM SUSP: 0.5000 mL | Freq: Once | INTRAMUSCULAR | Status: DC

## 2024-06-11 MED ORDER — MAGNESIUM SULFATE 40 GM/1000ML IV SOLN
2.0000 g/h | INTRAVENOUS | Status: AC
  Administered 2024-06-11: 2 g/h via INTRAVENOUS
  Filled 2024-06-11 (×2): qty 1000

## 2024-06-11 NOTE — Lactation Note (Signed)
 This note was copied from a baby's chart. Lactation Consultation Note  Patient Name: Catherine Lawrence Date: 06/11/2024 Age:24 years Reason for consult: Initial assessment;Primapara;1st time breastfeeding;Other (Comment);Early term 51-38.6wks (gHTN, Mag, PPH of 553 cc)  Visited with family of 59 33/37 weeks old Catherine Lawrence; Catherine Lawrence is a P1 and reported moderate breast changes during the pregnancy. Parents voiced they tried latching baby on earlier but he didn't do it, so they offered formula, baby already had 4 bottles 20-24 ml of Similac 20 calorie formula at 13 hours post-partum.  Offered assistance with latch and parents agreed to wake baby up to feed. This LC took baby STS to the L side in cross cradle hold but he struggled to latch; even though he was crying with a big wide open mouth. Did some suck training and transitioned back to the breast and he did a few suckles but fell asleep shortly after (see LATCH score).  Reviewed normal newborn behavior, 24-hour birthday nap, feeding cues, cluster feeding, size of baby's stomach, supplementation and anticipatory guidelines.  Maternal Data Has patient been taught Hand Expression?: Yes Does the patient have breastfeeding experience prior to this delivery?: No  Feeding Mother's Current Feeding Choice: Breast Milk and Formula  LATCH Score Latch: Repeated attempts needed to sustain latch, nipple held in mouth throughout feeding, stimulation needed to elicit sucking reflex.  Audible Swallowing: None  Type of Nipple: Everted at rest and after stimulation  Comfort (Breast/Nipple): Soft / non-tender  Hold (Positioning): Assistance needed to correctly position infant at breast and maintain latch.  LATCH Score: 6  Lactation Tools Discussed/Used Tools: Pump;Flanges Flange Size: 18 Breast pump type: Manual Pump Education: Setup, frequency, and cleaning;Milk Storage Reason for Pumping: patient's request Pumping  frequency: PRN, whenever baby is getting a bottle  Interventions Interventions: Breast feeding basics reviewed;Education;LC Services brochure;Hand pump  Plan STS whenever possible Offer the breast +8 times/24 hours or sooner if feeding cues are present Pump whenever baby is getting a bottle Parents will continue supplementing with EBM/Similac 20 calorie formula per feeding choice on admission Verify Stork pump issuance  FOB present and supportive. All questions and concerns answered, family to contact [facility] services PRN.  Discharge Discharge Education: Engorgement and breast care Pump: Manual  Consult Status Consult Status: Follow-up Date: 06/12/24 Follow-up type: In-patient   Catherine Lawrence Catherine Lawrence 06/11/2024, 11:58 AM

## 2024-06-11 NOTE — Progress Notes (Signed)
 Post Partum Day 1 Subjective: no complaints  Objective: Blood pressure 127/70, pulse 92, temperature 97.7 F (36.5 C), temperature source Oral, resp. rate 18, height 5' 3.5 (1.613 m), weight 111.7 kg, last menstrual period 09/13/2023, SpO2 100%, unknown if currently breastfeeding.  Physical Exam:  General: alert, cooperative, and no distress Lochia: appropriate Uterine Fundus: firm Incision:  DVT Evaluation: No evidence of DVT seen on physical exam.  Recent Labs    06/10/24 1935 06/11/24 0617  HGB 12.8 10.1*  HCT 36.9 28.9*    Assessment/Plan: Magnesium  d/c at 24 hr PP   LOS: 3 days   Lynwood Solomons, MD 06/11/2024, 11:21 AM

## 2024-06-12 LAB — COMPREHENSIVE METABOLIC PANEL WITH GFR
ALT: 18 U/L (ref 0–44)
AST: 28 U/L (ref 15–41)
Albumin: 3 g/dL — ABNORMAL LOW (ref 3.5–5.0)
Alkaline Phosphatase: 103 U/L (ref 38–126)
Anion gap: 9 (ref 5–15)
BUN: 7 mg/dL (ref 6–20)
CO2: 25 mmol/L (ref 22–32)
Calcium: 8.2 mg/dL — ABNORMAL LOW (ref 8.9–10.3)
Chloride: 103 mmol/L (ref 98–111)
Creatinine, Ser: 0.7 mg/dL (ref 0.44–1.00)
GFR, Estimated: >60 mL/min
Glucose, Bld: 86 mg/dL (ref 70–99)
Potassium: 3.7 mmol/L (ref 3.5–5.1)
Sodium: 137 mmol/L (ref 135–145)
Total Bilirubin: 0.4 mg/dL (ref 0.0–1.2)
Total Protein: 6 g/dL — ABNORMAL LOW (ref 6.5–8.1)

## 2024-06-12 MED ORDER — FUROSEMIDE 20 MG PO TABS: 20.0000 mg | ORAL_TABLET | Freq: Every day | ORAL | 0 refills | Status: AC

## 2024-06-12 MED ORDER — NIFEDIPINE ER OSMOTIC RELEASE 30 MG PO TB24: 30.0000 mg | ORAL_TABLET | Freq: Every day | ORAL | 1 refills | Status: DC

## 2024-06-12 NOTE — Lactation Note (Addendum)
 This note was copied from a baby's chart. Lactation Consultation Note  Patient Name: Catherine Lawrence Unijb'd Date: 06/12/2024 Age:23 hours Reason for consult: Follow-up assessment;Primapara;1st time breastfeeding;Other (Comment);Early term 80-38.6wks;Infant weight loss (gHTN, Mag, PPH of 553 cc, - 3.29% [facility])  Visited with family of 37 29/33 weeks old female Hector; Ms. Skufca is a P1 and reported she's tried taking baby to breast but he hasn't latched yet. She pumped twice since the last time she saw lactation. Parents have been supplementing with Similac 20 calorie formula at every feeding. Baby was taken to the nursery for his circumcision; couplet is expecting to be discharged today depending on bilirubin levels. Asked family to call for assistance when needed.  Reviewed discharge education and the importance of frequent and consistent nipple stimulation (baby's mouth or through pumping) for the onset of secretory activation and the prevention of engorgement. Discussed getting inserts for her Stork Spectra  pump. Referral sent to [facility] DEL for [facility] OP F/U. Encouraged +8 feedings/24 hours or sooner if feeding cues are present and to pump whenever baby is getting a bottle. FOB present and supportive. All questions and concerns answered, family to contact [facility] services PRN.  Feeding Mother's Current Feeding Choice: Breast Milk and Formula  Lactation Tools Discussed/Used Tools: Flanges;Pump Flange Size: 18 Breast pump type: Manual Pump Education: Setup, frequency, and cleaning;Milk Storage Reason for Pumping: patient's request Pumping frequency: 2 times/24 hours Pumped volume: 0 mL  Interventions Interventions: Breast feeding basics reviewed;Hand pump;Education  Discharge Discharge Education: Engorgement and breast care;Warning signs for feeding baby;Outpatient recommendation;Outpatient Epic message sent Pump: Received Stork Pump;Manual (Spectra  S2 Plus)  Consult Status Consult  Status: Complete Date: 06/12/24 Follow-up type: In-patient   Tressia Labrum GORMAN Crate 06/12/2024, 12:13 PM

## 2024-06-12 NOTE — Progress Notes (Signed)
 CSW received consult for hx of Anxiety and Depression. CSW met with MOB to offer support and complete assessment. When CSW entered the room, MOB was observed sitting in hospital bed. CSW introduced self and explained reason for consult. MOB presented as initially tearful and CSW assessed current mood. MOB explained that infant had just been taken for his circumcision and she expressed feeling worried about him. CSW validated and normalized MOB's experience. MOB explained that FOB had just left to get items from home. CSW offered to place call to central nursery to check on infant. MOB declined, sharing that she feels okay about moving forward with circumcision. MOB was able to return to a calm baseline and remained engaged throughout the encounter.   CSW inquired about MOB's mental health history. MOB acknowledged a history of anxiety and depression, stating she was initially diagnosed around age 24 or 59. MOB reports she is not currently receiving mental health treatment. MOB reports she used to meet with a therapist 3 years ago and trialed Lexapro and Prozac about 2 years ago but felt both medications made her symptoms worse so she stopped taking them. MOB reports endorsing some symptoms of depression and anxiety during pregnancy but reports her mood has improved over the last year. MOB reports her symptoms have felt manageable during pregnancy and declined additional support at this time. MOB identified FOB, her mom, FOB's mom, and her sister in law as her supports. CSW assessed for safety. MOB denied current SI/HI/domestic violence.   CSW provided education regarding the baby blues period vs. perinatal mood disorders, discussed treatment and gave resources for mental health follow up if concerns arise.  CSW recommends self-evaluation during the postpartum time period using the New Mom Checklist from Postpartum Progress and encouraged MOB to contact a medical professional if symptoms are noted at any time.     MOB reports she has a car seat, diapers, wipes, and clothes for infant but is still in need of a crib or bassinet. CSW explained the hospital can provide a pack n play, MOB expressed appreciation. CSW provided pack n play. CSW inquired about WIC benefits for formula and states she has a scheduled [facility] appointment this coming Wednesday, 06/16/24. CSW explained that the hospital is not able to provide formula to use after discharge and she will need to purchase formula until her [facility] appointment. MOB expressed understanding and denied barriers purchasing formula. MOB denied additional resource needs.   CSW provided review of Sudden Infant Death Syndrome (SIDS) precautions.    CSW identifies no further need for intervention and no barriers to discharge at this time.  Signed,  Sharyne LOIS Roulette, MSW, LCSWA, LCASA 06/12/2024 2:09 PM

## 2024-06-12 NOTE — Anesthesia Postprocedure Evaluation (Signed)
"   Anesthesia Post Note  Patient: Catherine Lawrence  Procedure(s) Performed: AN AD HOC LABOR EPIDURAL     Patient location during evaluation: Mother Baby Anesthesia Type: Epidural Level of consciousness: awake, awake and alert and oriented Pain management: satisfactory to patient Vital Signs Assessment: post-procedure vital signs reviewed and stable Respiratory status: spontaneous breathing, nonlabored ventilation and respiratory function stable Cardiovascular status: stable and blood pressure returned to baseline Postop Assessment: no headache, no backache, no apparent nausea or vomiting, able to ambulate, adequate PO intake and patient able to bend at knees Anesthetic complications: no   No notable events documented.  Last Vitals:  Vitals:   06/12/24 0350 06/12/24 0837  BP: 134/69 134/76  Pulse: 82 79  Resp: 18 16  Temp: 36.6 C 36.8 C  SpO2: 100% 100%    Last Pain:  Vitals:   06/12/24 0837  TempSrc: Oral  PainSc:    Pain Goal:                   Jaquane Boughner      "

## 2024-06-15 ENCOUNTER — Encounter (HOSPITAL_COMMUNITY): Payer: Self-pay | Admitting: Obstetrics and Gynecology

## 2024-06-15 ENCOUNTER — Inpatient Hospital Stay (HOSPITAL_COMMUNITY)
Admission: AD | Admit: 2024-06-15 | Discharge: 2024-06-15 | Disposition: A | Discharge status: Home / Self Care | Attending: Obstetrics and Gynecology | Admitting: Obstetrics and Gynecology

## 2024-06-15 DIAGNOSIS — Z79899 Other long term (current) drug therapy: Secondary | ICD-10-CM | POA: Insufficient documentation

## 2024-06-15 DIAGNOSIS — O1410 Severe pre-eclampsia, unspecified trimester: Secondary | ICD-10-CM

## 2024-06-15 LAB — COMPREHENSIVE METABOLIC PANEL WITH GFR
ALT: 42 U/L (ref 0–44)
AST: 35 U/L (ref 15–41)
Albumin: 3.7 g/dL (ref 3.5–5.0)
Alkaline Phosphatase: 121 U/L (ref 38–126)
Anion gap: 12 (ref 5–15)
BUN: 10 mg/dL (ref 6–20)
CO2: 24 mmol/L (ref 22–32)
Calcium: 9.3 mg/dL (ref 8.9–10.3)
Chloride: 101 mmol/L (ref 98–111)
Creatinine, Ser: 0.71 mg/dL (ref 0.44–1.00)
GFR, Estimated: >60 mL/min
Glucose, Bld: 133 mg/dL — ABNORMAL HIGH (ref 70–99)
Potassium: 4.3 mmol/L (ref 3.5–5.1)
Sodium: 137 mmol/L (ref 135–145)
Total Bilirubin: 0.5 mg/dL (ref 0.0–1.2)
Total Protein: 7.1 g/dL (ref 6.5–8.1)

## 2024-06-15 LAB — CBC
HCT: 30.9 % — ABNORMAL LOW (ref 36.0–46.0)
Hemoglobin: 10.6 g/dL — ABNORMAL LOW (ref 12.0–15.0)
MCH: 30.7 pg (ref 26.0–34.0)
MCHC: 34.3 g/dL (ref 30.0–36.0)
MCV: 89.6 fL (ref 80.0–100.0)
Platelets: 320 K/uL (ref 150–400)
RBC: 3.45 MIL/uL — ABNORMAL LOW (ref 3.87–5.11)
RDW: 12.5 % (ref 11.5–15.5)
WBC: 11.9 K/uL — ABNORMAL HIGH (ref 4.0–10.5)
nRBC: 0 % (ref 0.0–0.2)

## 2024-06-15 LAB — PROTEIN / CREATININE RATIO, URINE
Creatinine, Urine: 221 mg/dL
Protein Creatinine Ratio: 0.6 mg/mg — ABNORMAL HIGH
Total Protein, Urine: 121 mg/dL

## 2024-06-15 MED ORDER — NIFEDIPINE ER 60 MG PO TB24: 60.0000 mg | ORAL_TABLET | Freq: Every day | ORAL | 0 refills | Status: AC

## 2024-06-15 NOTE — Discharge Instructions (Signed)
 It was great seeing you today!  Your blood pressure was a little higher than I want so sent a prescription for Procardia  to your pharmacy.  I want to increase the dose to 60 mg daily.  You can take 2 of your 30 mg until you pick up the new dose.  Be sure to go to the blood pressure check on Friday.  If your symptoms worsen or you have any other concerns please return.  I hope you have a great rest of your night!

## 2024-06-15 NOTE — MAU Note (Signed)
 Catherine Lawrence is a 24 y.o. at Unknown here in MAU reporting taking her b/p tonight and it was 152/87. Pt had svd on 06/10/24. Reports a h/a since 1830. Has not taken anything for the h/a. Occ blurry vision.   LMP: na Onset of complaint: 2 hours Pain score: 6 Vitals:   06/15/24 2026 06/15/24 2030  BP:  (!) 140/83  Pulse: (!) 108   Resp: 17   Temp: 98.4 F (36.9 C)      FHT: na  Lab orders placed from triage: none

## 2024-06-15 NOTE — MAU Provider Note (Signed)
 " History     CSN: [id number]  Arrival date and time: 06/15/24 1949   None     Chief Complaint  Patient presents with   Hypertension   Headache   HPI Patient is a 24 year old G1, P1 who is 5 days postpartum presenting to the MAU for evaluation for concern for worsening preeclampsia.  Reports after discharge she was sent home on Procardia  30 mg daily and she checked her blood pressure tonight and it was 152/87.  She also reports a headache since 6:30 PM with occasional episodes of blurry vision.  Denies any other symptoms.  Of note was delivered for severe preeclampsia with elevated creatinine.  Received magnesium  for neuroprotection during labor.  OB History     Gravida  1   Para  1   Term  1   Preterm      AB      Living  1      SAB      IAB      Ectopic      Multiple  0   Live Births  1           Past Medical History:  Diagnosis Date   Anemia    Anxiety    Depression    hx of depression    Eczema    Headache    Low calcium levels    Palpitations    has not had work up    Past Surgical History:  Procedure Laterality Date   NO PAST SURGERIES      Family History  Problem Relation Age of Onset   Diabetes Father    Diabetes Mother    Hypertension Mother    Hyperthyroidism Mother    Migraines Neg Hx     Social History[1]  Allergies: Allergies[2]  Medications Prior to Admission  Medication Sig Dispense Refill Last Dose/Taking   FEROSUL 325 (65 Fe) MG tablet Take by mouth.   06/14/2024   furosemide  (LASIX ) 20 MG tablet Take 1 tablet (20 mg total) by mouth daily. 3 tablet 0 06/14/2024   NIFEdipine  (PROCARDIA -XL/NIFEDICAL-XL) 30 MG 24 hr tablet Take 1 tablet (30 mg total) by mouth daily. 90 tablet 1 06/14/2024   Prenatal Vit-Fe Fumarate-FA (PRENATAL MULTIVITAMIN) TABS tablet Take 1 tablet by mouth daily at 12 noon. (Patient not taking: Reported on 06/15/2024)   Not Taking    Review of Systems  Constitutional:  Negative for chills and fever.   Eyes:  Positive for visual disturbance.  Gastrointestinal:  Negative for abdominal pain.  Genitourinary:  Positive for vaginal bleeding.  Neurological:  Positive for headaches.   Physical Exam   Blood pressure 134/79, pulse (!) 101, temperature 98.4 F (36.9 C), resp. rate 18, height 5' 3 (1.6 m), weight 102 kg, unknown if currently breastfeeding.  Physical Exam Vitals and nursing note reviewed.  Constitutional:      Appearance: Normal appearance.  HENT:     Head: Normocephalic and atraumatic.     Nose: No congestion or rhinorrhea.  Eyes:     Extraocular Movements: Extraocular movements intact.  Cardiovascular:     Rate and Rhythm: Normal rate.  Pulmonary:     Effort: Pulmonary effort is normal.  Abdominal:     Palpations: Abdomen is soft.     Tenderness: There is no abdominal tenderness.  Musculoskeletal:        General: Normal range of motion.     Cervical back: Normal range of motion.  Skin:  General: Skin is warm.     Capillary Refill: Capillary refill takes less than 2 seconds.  Neurological:     General: No focal deficit present.     Mental Status: She is alert.     Cranial Nerves: No cranial nerve deficit.  Psychiatric:        Mood and Affect: Mood normal.        Behavior: Behavior normal.     MAU Course  Procedures  MDM CBC CMP Physical exam   Assessment and Plan  Catherine Lawrence is a 25 year old G1, P1 5 days postpartum after vaginal delivery, she was induced for severe preeclampsia and received magnesium .  Severe preeclampsia Patient concerned about worsening preeclampsia.  Blood pressure at home 150s/80s.  Initial blood pressure in the MAU 140s/80s but has had multiple high 130s/80s.  Is on Procardia  30 mg daily.  CBC within normal limits.  CMP within normal limits.  On reevaluation patient reports her headache is gone and her blurry vision is also gone.  She has a blood pressure check scheduled for Friday.  Discussed increasing Procardia   to 60 mg daily.  Discussed at length strict MAU precautions.  No further questions or concerns.  Patient discharged home.  Catherine Lawrence 06/15/2024, 10:30 PM     [1]  Social History Tobacco Use   Smoking status: Never   Smokeless tobacco: Never   Tobacco comments:    Quit vaping with +preg  Vaping Use   Vaping status: Former   Substances: Nicotine  Substance Use Topics   Alcohol use: Not Currently    Comment: socially   Drug use: Never  [2]  Allergies Allergen Reactions   Aleve Arthritis Pain [Diclofenac Sodium] Rash   "

## 2024-06-16 ENCOUNTER — Other Ambulatory Visit: Payer: Self-pay | Admitting: Family Medicine

## 2024-06-18 ENCOUNTER — Ambulatory Visit

## 2024-06-18 ENCOUNTER — Ambulatory Visit (INDEPENDENT_AMBULATORY_CARE_PROVIDER_SITE_OTHER): Payer: Self-pay

## 2024-06-18 ENCOUNTER — Encounter

## 2024-06-18 ENCOUNTER — Encounter: Admitting: Family Medicine

## 2024-06-18 ENCOUNTER — Other Ambulatory Visit: Payer: Self-pay

## 2024-06-18 VITALS — BP 122/70 | Wt 220.3 lb

## 2024-06-18 DIAGNOSIS — Z0130 Encounter for examination of blood pressure without abnormal findings: Secondary | ICD-10-CM

## 2024-06-18 NOTE — Progress Notes (Signed)
 Blood Pressure Check Visit  Catherine Lawrence is here for blood pressure check following spontaneous vaginal birth on 06/10/24. She was induced for gestation hypertension, later diagnosed severe pre-eclampsia.  BP today is 122/70. Patient denies any dizziness, blurred vision, headache, chest pain,shortness of breath, or peripheral edema.   She will follow up with Dr. Eldonna at her postpartum visit on 07/13/23.    Waddell LITTIE Burows, RN 06/18/2024  10:04 AM

## 2024-06-25 ENCOUNTER — Encounter

## 2024-06-25 ENCOUNTER — Encounter: Admitting: Family Medicine

## 2024-07-02 ENCOUNTER — Encounter: Admitting: Family Medicine

## 2024-07-02 ENCOUNTER — Encounter

## 2024-07-09 ENCOUNTER — Ambulatory Visit: Admitting: Family Medicine

## 2024-07-12 ENCOUNTER — Ambulatory Visit: Admitting: Family Medicine
